# Patient Record
Sex: Male | Born: 1990 | Race: Black or African American | Hispanic: No | Marital: Single | Smoking: Never smoker
Health system: Southern US, Community
[De-identification: ages and names within clinical notes are randomized; demographics above are authoritative.]

---
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
## 2011-02-07 ENCOUNTER — Emergency Department (HOSPITAL_COMMUNITY): Payer: Self-pay

## 2011-02-07 ENCOUNTER — Emergency Department (HOSPITAL_COMMUNITY)
Admission: EM | Admit: 2011-02-07 | Discharge: 2011-02-07 | Disposition: A | Discharge status: Home / Self Care | Payer: Self-pay | Attending: Emergency Medicine | Admitting: Emergency Medicine

## 2011-02-07 DIAGNOSIS — M542 Cervicalgia: Secondary | ICD-10-CM | POA: Insufficient documentation

## 2011-02-07 DIAGNOSIS — S060X0A Concussion without loss of consciousness, initial encounter: Secondary | ICD-10-CM | POA: Insufficient documentation

## 2011-02-07 DIAGNOSIS — S139XXA Sprain of joints and ligaments of unspecified parts of neck, initial encounter: Secondary | ICD-10-CM | POA: Insufficient documentation

## 2011-02-07 DIAGNOSIS — R51 Headache: Secondary | ICD-10-CM | POA: Insufficient documentation

## 2011-02-07 DIAGNOSIS — R21 Rash and other nonspecific skin eruption: Secondary | ICD-10-CM | POA: Insufficient documentation

## 2011-02-07 DIAGNOSIS — Y929 Unspecified place or not applicable: Secondary | ICD-10-CM | POA: Insufficient documentation

## 2011-06-24 LAB — RAPID STREP SCREEN (MED CTR MEBANE ONLY): Streptococcus, Group A Screen (Direct): NEGATIVE

## 2011-12-21 ENCOUNTER — Encounter (HOSPITAL_COMMUNITY): Payer: Self-pay | Admitting: *Deleted

## 2011-12-21 ENCOUNTER — Emergency Department (HOSPITAL_COMMUNITY)
Admission: EM | Admit: 2011-12-21 | Discharge: 2011-12-21 | Disposition: A | Discharge status: Home / Self Care | Payer: No Typology Code available for payment source | Attending: Emergency Medicine | Admitting: Emergency Medicine

## 2011-12-21 ENCOUNTER — Emergency Department (HOSPITAL_COMMUNITY): Payer: No Typology Code available for payment source

## 2011-12-21 DIAGNOSIS — S40011A Contusion of right shoulder, initial encounter: Secondary | ICD-10-CM

## 2011-12-21 DIAGNOSIS — S60221A Contusion of right hand, initial encounter: Secondary | ICD-10-CM

## 2011-12-21 DIAGNOSIS — S60229A Contusion of unspecified hand, initial encounter: Secondary | ICD-10-CM | POA: Insufficient documentation

## 2011-12-21 DIAGNOSIS — Y92410 Unspecified street and highway as the place of occurrence of the external cause: Secondary | ICD-10-CM | POA: Insufficient documentation

## 2011-12-21 DIAGNOSIS — S40019A Contusion of unspecified shoulder, initial encounter: Secondary | ICD-10-CM | POA: Insufficient documentation

## 2011-12-21 MED ORDER — HYDROCODONE-ACETAMINOPHEN 5-500 MG PO TABS: 1.0000 | ORAL_TABLET | Freq: Four times a day (QID) | ORAL | Status: AC | PRN

## 2011-12-21 NOTE — ED Notes (Signed)
Also c/o right hand pain

## 2011-12-21 NOTE — Discharge Instructions (Signed)
° °
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
## 2012-07-31 ENCOUNTER — Encounter (HOSPITAL_COMMUNITY): Payer: Self-pay | Admitting: *Deleted

## 2012-07-31 ENCOUNTER — Emergency Department (HOSPITAL_COMMUNITY)
Admission: EM | Admit: 2012-07-31 | Discharge: 2012-07-31 | Disposition: A | Discharge status: Home / Self Care | Payer: BC Managed Care – HMO | Attending: Emergency Medicine | Admitting: Emergency Medicine

## 2012-07-31 DIAGNOSIS — Y939 Activity, unspecified: Secondary | ICD-10-CM | POA: Insufficient documentation

## 2012-07-31 DIAGNOSIS — Y929 Unspecified place or not applicable: Secondary | ICD-10-CM | POA: Insufficient documentation

## 2012-07-31 DIAGNOSIS — T2220XA Burn of second degree of shoulder and upper limb, except wrist and hand, unspecified site, initial encounter: Secondary | ICD-10-CM | POA: Insufficient documentation

## 2012-07-31 DIAGNOSIS — X12XXXA Contact with other hot fluids, initial encounter: Secondary | ICD-10-CM | POA: Insufficient documentation

## 2012-07-31 DIAGNOSIS — X131XXA Other contact with steam and other hot vapors, initial encounter: Secondary | ICD-10-CM | POA: Insufficient documentation

## 2012-07-31 MED ORDER — SILVER SULFADIAZINE 1 % EX CREA
TOPICAL_CREAM | CUTANEOUS | Status: AC
  Administered 2012-07-31: 19:00:00
  Filled 2012-07-31: qty 50

## 2012-07-31 MED ORDER — SODIUM CHLORIDE 0.9 % IV BOLUS (SEPSIS)
1000.0000 mL | Freq: Once | INTRAVENOUS | Status: AC
  Administered 2012-07-31: 1000 mL via INTRAVENOUS

## 2012-07-31 MED ORDER — TETANUS-DIPHTH-ACELL PERTUSSIS 5-2.5-18.5 LF-MCG/0.5 IM SUSP
0.5000 mL | Freq: Once | INTRAMUSCULAR | Status: AC
  Administered 2012-07-31: 0.5 mL via INTRAMUSCULAR
  Filled 2012-07-31: qty 0.5

## 2012-07-31 MED ORDER — KETOROLAC TROMETHAMINE 30 MG/ML IJ SOLN
30.0000 mg | Freq: Once | INTRAMUSCULAR | Status: AC
  Administered 2012-07-31: 30 mg via INTRAVENOUS
  Filled 2012-07-31: qty 1

## 2012-07-31 MED ORDER — ONDANSETRON HCL 4 MG/2ML IJ SOLN
4.0000 mg | Freq: Once | INTRAMUSCULAR | Status: AC
  Administered 2012-07-31: 4 mg via INTRAVENOUS
  Filled 2012-07-31: qty 2

## 2012-07-31 MED ORDER — OXYCODONE-ACETAMINOPHEN 5-325 MG PO TABS: 1.0000 | ORAL_TABLET | Freq: Four times a day (QID) | ORAL | Status: DC | PRN

## 2012-07-31 MED ORDER — HYDROMORPHONE HCL PF 1 MG/ML IJ SOLN
1.0000 mg | Freq: Once | INTRAMUSCULAR | Status: AC
  Administered 2012-07-31: 1 mg via INTRAVENOUS
  Filled 2012-07-31: qty 1

## 2012-07-31 MED ORDER — PROMETHAZINE HCL 25 MG PO TABS: 25.0000 mg | ORAL_TABLET | Freq: Four times a day (QID) | ORAL | Status: DC | PRN

## 2012-07-31 NOTE — ED Notes (Signed)
Bedside report received from previous RN 
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
## 2012-07-31 NOTE — ED Notes (Signed)
Pt alert and oriented x4. Respirations even and unlabored, bilateral symmetrical rise and fall of chest. Skin warm and dry. In no acute distress. Denies needs.   
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
## 2012-08-07 ENCOUNTER — Encounter (HOSPITAL_COMMUNITY): Payer: Self-pay | Admitting: Emergency Medicine

## 2012-08-07 ENCOUNTER — Emergency Department (HOSPITAL_COMMUNITY)
Admission: EM | Admit: 2012-08-07 | Discharge: 2012-08-07 | Disposition: A | Discharge status: Home / Self Care | Payer: BC Managed Care – HMO | Attending: Emergency Medicine | Admitting: Emergency Medicine

## 2012-08-07 DIAGNOSIS — Y929 Unspecified place or not applicable: Secondary | ICD-10-CM | POA: Insufficient documentation

## 2012-08-07 DIAGNOSIS — Z48817 Encounter for surgical aftercare following surgery on the skin and subcutaneous tissue: Secondary | ICD-10-CM | POA: Insufficient documentation

## 2012-08-07 DIAGNOSIS — Z5189 Encounter for other specified aftercare: Secondary | ICD-10-CM

## 2012-08-07 DIAGNOSIS — Y939 Activity, unspecified: Secondary | ICD-10-CM | POA: Insufficient documentation

## 2012-08-07 DIAGNOSIS — X12XXXA Contact with other hot fluids, initial encounter: Secondary | ICD-10-CM | POA: Insufficient documentation

## 2012-08-07 DIAGNOSIS — T22219A Burn of second degree of unspecified forearm, initial encounter: Secondary | ICD-10-CM | POA: Insufficient documentation

## 2012-08-07 DIAGNOSIS — IMO0002 Reserved for concepts with insufficient information to code with codable children: Secondary | ICD-10-CM

## 2012-08-07 MED ORDER — SILVER SULFADIAZINE 1 % EX CREA
TOPICAL_CREAM | Freq: Every day | CUTANEOUS | Status: DC
  Administered 2012-08-07: 20:00:00 via TOPICAL
  Filled 2012-08-07: qty 50

## 2012-08-07 NOTE — ED Provider Notes (Signed)
Medical screening examination/treatment/procedure(s) were performed by non-physician practitioner and as supervising physician I was immediately available for consultation/collaboration.  Karilyn Wind L Wretha Laris, MD 08/07/12 2329 
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
## 2013-08-21 ENCOUNTER — Encounter (HOSPITAL_COMMUNITY): Payer: Self-pay | Admitting: Emergency Medicine

## 2013-08-21 ENCOUNTER — Emergency Department (INDEPENDENT_AMBULATORY_CARE_PROVIDER_SITE_OTHER)
Admission: EM | Admit: 2013-08-21 | Discharge: 2013-08-21 | Disposition: A | Discharge status: Home / Self Care | Payer: Self-pay | Source: Home / Self Care | Attending: Emergency Medicine | Admitting: Emergency Medicine

## 2013-08-21 DIAGNOSIS — J069 Acute upper respiratory infection, unspecified: Secondary | ICD-10-CM

## 2013-08-21 NOTE — ED Provider Notes (Signed)
Medical screening examination/treatment/procedure(s) were performed by non-physician practitioner and as supervising physician I was immediately available for consultation/collaboration.  Leslee Home, M.D.  Reuben Likes, MD 08/21/13 [phone]

## 2013-08-21 NOTE — ED Provider Notes (Signed)
CSN: [id number]     Arrival date & time 08/21/13  1843 History   First MD Initiated Contact with Patient 08/21/13 1906     Chief Complaint  Patient presents with  . Sore Throat    Friday started having sore throat, coughing.     (Consider location/radiation/quality/duration/timing/severity/associated sxs/prior Treatment) Patient is a 22 y.o. male presenting with URI. The history is provided by the patient.  URI Presenting symptoms: congestion, cough, ear pain, fever, rhinorrhea and sore throat   Presenting symptoms: no facial pain and no fatigue   Severity:  Moderate Onset quality:  Gradual Duration:  2 days Timing:  Constant Progression:  Unchanged Chronicity:  New Relieved by:  Nothing Worsened by:  Nothing tried Ineffective treatments:  OTC medications (tussin cf) Associated symptoms: no myalgias, no sinus pain and no swollen glands     History reviewed. No pertinent past medical history. History reviewed. No pertinent past surgical history. History reviewed. No pertinent family history. History  Substance Use Topics  . Smoking status: Never Smoker   . Smokeless tobacco: Never Used  . Alcohol Use: No    Review of Systems  Constitutional: Positive for fever and chills. Negative for fatigue.  HENT: Positive for congestion, ear pain, postnasal drip, rhinorrhea and sore throat. Negative for sinus pressure.   Respiratory: Positive for cough.   Musculoskeletal: Negative for myalgias.    Allergies  Review of patient's allergies indicates no known allergies.  Home Medications   Current Outpatient Rx  Name  Route  Sig  Dispense  Refill  . oxyCODONE-acetaminophen (PERCOCET/ROXICET) 5-325 MG per tablet   Oral   Take 1-2 tablets by mouth every 6 (six) hours as needed for pain.   20 tablet   0   . silver sulfADIAZINE (SILVADENE) 1 % cream   Topical   Apply 1 application topically 2 (two) times daily. For burn          BP 129/76  Pulse 86  Temp(Src) 100.6 F  (38.1 C) (Oral)  Resp 18  SpO2 99% Physical Exam  Constitutional: He appears well-developed and well-nourished. No distress.  HENT:  Right Ear: Tympanic membrane, external ear and ear canal normal.  Left Ear: Tympanic membrane, external ear and ear canal normal.  Nose: Mucosal edema and rhinorrhea present. Right sinus exhibits no maxillary sinus tenderness and no frontal sinus tenderness. Left sinus exhibits no maxillary sinus tenderness and no frontal sinus tenderness.  Mouth/Throat: Oropharynx is clear and moist and mucous membranes are normal.  Cardiovascular: Normal rate and regular rhythm.   Pulmonary/Chest: Effort normal and breath sounds normal.  Lymphadenopathy:       Head (right side): No submental, no submandibular and no tonsillar adenopathy present.       Head (left side): No submental, no submandibular and no tonsillar adenopathy present.    ED Course  Procedures (including critical care time) Labs Review Labs Reviewed - No data to display Imaging Review No results found.  EKG Interpretation    Date/Time:    Ventricular Rate:    PR Interval:    QRS Duration:   QT Interval:    QTC Calculation:   R Axis:     Text Interpretation:              MDM   1. URI (upper respiratory infection)   recommended saline nasal spray and cold medicine with decongestant.     Cathlyn Parsons, NP 08/21/13 [phone]

## 2013-08-21 NOTE — ED Notes (Signed)
Having a sore throat when coughs.  Has been cold past few days.  No nausea, vomitting, or diahrrea.

## 2013-09-19 ENCOUNTER — Emergency Department (HOSPITAL_COMMUNITY): Payer: Self-pay

## 2013-09-19 ENCOUNTER — Encounter (HOSPITAL_COMMUNITY): Payer: Self-pay | Admitting: Emergency Medicine

## 2013-09-19 ENCOUNTER — Emergency Department (HOSPITAL_COMMUNITY)
Admission: EM | Admit: 2013-09-19 | Discharge: 2013-09-19 | Disposition: A | Discharge status: Home / Self Care | Payer: Self-pay | Attending: Emergency Medicine | Admitting: Emergency Medicine

## 2013-09-19 DIAGNOSIS — K5289 Other specified noninfective gastroenteritis and colitis: Secondary | ICD-10-CM | POA: Insufficient documentation

## 2013-09-19 DIAGNOSIS — K529 Noninfective gastroenteritis and colitis, unspecified: Secondary | ICD-10-CM

## 2013-09-19 LAB — BASIC METABOLIC PANEL
BUN: 13 mg/dL (ref 6–23)
CALCIUM: 9.1 mg/dL (ref 8.4–10.5)
CO2: 27 meq/L (ref 19–32)
CREATININE: 1.31 mg/dL (ref 0.50–1.35)
Chloride: 99 mEq/L (ref 96–112)
GFR calc Af Amer: 89 mL/min — ABNORMAL LOW (ref >90)
GFR calc non Af Amer: 77 mL/min — ABNORMAL LOW (ref >90)
GLUCOSE: 132 mg/dL — AB (ref 70–99)
Potassium: 4.4 mEq/L (ref 3.7–5.3)
Sodium: 138 mEq/L (ref 137–147)

## 2013-09-19 LAB — CBC WITH DIFFERENTIAL/PLATELET
BASOS ABS: 0 10*3/uL (ref 0.0–0.1)
Basophils Relative: 0 % (ref 0–1)
EOS PCT: 0 % (ref 0–5)
Eosinophils Absolute: 0 10*3/uL (ref 0.0–0.7)
HEMATOCRIT: 47 % (ref 39.0–52.0)
HEMOGLOBIN: 15.5 g/dL (ref 13.0–17.0)
LYMPHS PCT: 3 % — AB (ref 12–46)
Lymphs Abs: 0.3 10*3/uL — ABNORMAL LOW (ref 0.7–4.0)
MCH: 29.5 pg (ref 26.0–34.0)
MCHC: 33 g/dL (ref 30.0–36.0)
MCV: 89.5 fL (ref 78.0–100.0)
MONO ABS: 0.4 10*3/uL (ref 0.1–1.0)
MONOS PCT: 4 % (ref 3–12)
Neutro Abs: 9.6 10*3/uL — ABNORMAL HIGH (ref 1.7–7.7)
Neutrophils Relative %: 92 % — ABNORMAL HIGH (ref 43–77)
Platelets: 177 10*3/uL (ref 150–400)
RBC: 5.25 MIL/uL (ref 4.22–5.81)
RDW: 13.1 % (ref 11.5–15.5)
WBC: 10.4 10*3/uL (ref 4.0–10.5)

## 2013-09-19 MED ORDER — MORPHINE SULFATE 4 MG/ML IJ SOLN
4.0000 mg | Freq: Once | INTRAMUSCULAR | Status: AC
  Administered 2013-09-19: 4 mg via INTRAVENOUS
  Filled 2013-09-19: qty 1

## 2013-09-19 MED ORDER — ACETAMINOPHEN 325 MG PO TABS
650.0000 mg | ORAL_TABLET | Freq: Once | ORAL | Status: DC
  Filled 2013-09-19: qty 2

## 2013-09-19 MED ORDER — IOHEXOL 300 MG/ML  SOLN: 50.0000 mL | Freq: Once | INTRAMUSCULAR | Status: DC | PRN

## 2013-09-19 MED ORDER — IOHEXOL 300 MG/ML  SOLN
100.0000 mL | Freq: Once | INTRAMUSCULAR | Status: AC | PRN
  Administered 2013-09-19: 100 mL via INTRAVENOUS

## 2013-09-19 MED ORDER — KETOROLAC TROMETHAMINE 30 MG/ML IJ SOLN
30.0000 mg | Freq: Once | INTRAMUSCULAR | Status: AC
  Administered 2013-09-19: 30 mg via INTRAVENOUS
  Filled 2013-09-19: qty 1

## 2013-09-19 MED ORDER — ACETAMINOPHEN 325 MG PO TABS
650.0000 mg | ORAL_TABLET | Freq: Once | ORAL | Status: AC
  Administered 2013-09-19: 650 mg via ORAL
  Filled 2013-09-19: qty 2

## 2013-09-19 MED ORDER — PROMETHAZINE HCL 25 MG PO TABS: 25.0000 mg | ORAL_TABLET | Freq: Four times a day (QID) | ORAL | Status: DC | PRN

## 2013-09-19 MED ORDER — TRAMADOL HCL 50 MG PO TABS: 50.0000 mg | ORAL_TABLET | Freq: Four times a day (QID) | ORAL | Status: DC | PRN

## 2013-09-19 MED ORDER — ONDANSETRON HCL 4 MG/2ML IJ SOLN
4.0000 mg | Freq: Once | INTRAMUSCULAR | Status: AC
  Administered 2013-09-19: 4 mg via INTRAVENOUS
  Filled 2013-09-19: qty 2

## 2013-09-19 MED ORDER — SODIUM CHLORIDE 0.9 % IV BOLUS (SEPSIS)
1000.0000 mL | Freq: Once | INTRAVENOUS | Status: AC
  Administered 2013-09-19: 1000 mL via INTRAVENOUS

## 2013-09-19 NOTE — ED Provider Notes (Signed)
CSN: [id number]     Arrival date & time 09/19/13  1402 History   First MD Initiated Contact with Patient 09/19/13 1535     Chief Complaint  Patient presents with  . Emesis  . Diarrhea   (Consider location/radiation/quality/duration/timing/severity/associated sxs/prior Treatment) HPI  Jason Campbell is a 22 y.o.male without any significant PMH presents to the ER with complaints of vomiting 4 times and diarrhea x3 episodes since 3am this morning. He is not having any belly pain with this but is having some lower extremity body aches and some low back crampy pain. He denies have fevers, sore throat, ear pain, headache, weakness, confusion, hematuria/dysuria.   History reviewed. No pertinent past medical history. History reviewed. No pertinent past surgical history. No family history on file. History  Substance Use Topics  . Smoking status: Never Smoker   . Smokeless tobacco: Never Used  . Alcohol Use: No    Review of Systems The patient denies anorexia, fever, weight loss,, vision loss, decreased hearing, hoarseness, chest pain, syncope, dyspnea on exertion, peripheral edema, balance deficits, hemoptysis, abdominal pain, melena, hematochezia, severe indigestion/heartburn, hematuria, incontinence, genital sores, muscle weakness, suspicious skin lesions, transient blindness, difficulty walking, depression, unusual weight change, abnormal bleeding, enlarged lymph nodes, angioedema, and breast masses.  Allergies  Review of patient's allergies indicates no known allergies.  Home Medications   Current Outpatient Rx  Name  Route  Sig  Dispense  Refill  . promethazine (PHENERGAN) 25 MG tablet   Oral   Take 1 tablet (25 mg total) by mouth every 6 (six) hours as needed for nausea or vomiting.   30 tablet   0   . traMADol (ULTRAM) 50 MG tablet   Oral   Take 1 tablet (50 mg total) by mouth every 6 (six) hours as needed.   20 tablet   0    BP 103/82  Pulse 86  Temp(Src) 98.4  F (36.9 C) (Oral)  Resp 18  SpO2 99% Physical Exam  Nursing note and vitals reviewed. Constitutional: He appears well-developed and well-nourished. No distress.  HENT:  Head: Normocephalic and atraumatic.  Eyes: Pupils are equal, round, and reactive to light.  Neck: Normal range of motion. Neck supple.  Cardiovascular: Normal rate and regular rhythm.   Pulmonary/Chest: Effort normal. No respiratory distress. He has no wheezes.  Abdominal: Soft. There is no tenderness. There is no rigidity, no rebound, no guarding, no CVA tenderness, no tenderness at McBurney's point and negative Murphy's sign.  Neurological: He is alert.  Skin: Skin is warm and dry.    ED Course  Procedures (including critical care time) Labs Review Labs Reviewed  CBC WITH DIFFERENTIAL - Abnormal; Notable for the following:    Neutrophils Relative % 92 (*)    Neutro Abs 9.6 (*)    Lymphocytes Relative 3 (*)    Lymphs Abs 0.3 (*)    All other components within normal limits  BASIC METABOLIC PANEL - Abnormal; Notable for the following:    Glucose, Bld 132 (*)    GFR calc non Af Amer 77 (*)    GFR calc Af Amer 89 (*)    All other components within normal limits   Imaging Review Ct Abdomen Pelvis W Contrast  09/19/2013   CLINICAL DATA:  Abdominal pain. Nausea and vomiting. Abnormal abdominal radiograph.  EXAM: CT ABDOMEN AND PELVIS WITH CONTRAST  TECHNIQUE: Multidetector CT imaging of the abdomen and pelvis was performed using the standard protocol following bolus administration of intravenous contrast.  CONTRAST:  1 OMNIPAQUE IOHEXOL 300 MG/ML SOLN, 100mL OMNIPAQUE IOHEXOL 300 MG/ML SOLN  COMPARISON:  DG ABD 2 VIEWS dated 09/19/2013  FINDINGS: Lung Bases: Clear.  Liver:  Normal.  Spleen:  Normal.  Gallbladder:  Normal.  Common bile duct:  Normal.  Pancreas:  Normal.  Adrenal glands:  Normal.  Kidneys: Normal renal enhancement. No calculi. The ureters appear normal.  Stomach:  Mostly collapsed.  No inflammatory  changes.  Small bowel: There are no dilated loops of small bowel. Small bowel loop identified on prior plain films distended with oral contrast. There is no obstruction. Maximal diameter of small bowel is 27 mm. Distal small bowel appears normal. Ingested oral contrast is about halfway through the small bowel.  Colon: Normal appendix adjacent to the cecum. No right lower quadrant inflammatory changes. The colon appears within normal limits. The distal colon is decompressed.  Pelvic Genitourinary:  Normal urinary bladder.  No free fluid.  Bones: Schmorl's nodes are present in the thoracolumbar spine. No acute abnormality.  Vasculature: Normal.  Body Wall: Normal.  IMPRESSION: Normal CT abdomen and pelvis. No dilation of small bowel or evidence of enteritis or obstruction.   Electronically Signed   By: Andreas NewportGeoffrey  Lamke M.D.   On: 09/19/2013 18:54   Dg Abd 2 Views  09/19/2013   CLINICAL DATA:  Diarrhea and vomiting.  Low back pain.  EXAM: ABDOMEN - 2 VIEW  COMPARISON:  None.  FINDINGS: There is a mildly dilated loop of small bowel in the left central abdomen. This may be associated with obstruction or enteritis/infection. Scattered air-fluid levels are present. There is thickening of the small bowel wall markings. Colonic gas is present. Phleboliths present in the anatomic pelvis.  There is no free air or organomegaly.  IMPRESSION: Single loop of dilated small bowel in the left abdomen. This may be associated with obstruction, enteritis (including infectious). CT may be useful further assessment, preferably with oral and IV contrast.   Electronically Signed   By: Andreas NewportGeoffrey  Lamke M.D.   On: 09/19/2013 17:02    EKG Interpretation   None       MDM   1. Gastroenteritis    Plan films have come back with concerns for possible obstruction of enteritis.  The labs and CT scan of the abdomen/ pelvis are reassuring.  Maintain hydration by drinking small amounts of clear fluids frequently, then soft diet, and  then advance diet as tolerated. May use OTC Imodium if desired for any diarrhea.  Call if symptoms worsen, high fever, severe weakness or fainting, increased abdominal pain, blood in stool or vomit, or failure to improve in 2-3 days.  Rx; phenergan and Ultram  22 y.o.Jason Campbell's evaluation in the Emergency Department is complete. It has been determined that no acute conditions requiring further emergency intervention are present at this time. The patient/guardian have been advised of the diagnosis and plan. We have discussed signs and symptoms that warrant return to the ED, such as changes or worsening in symptoms.  Vital signs are stable at discharge. Filed Vitals:   09/19/13 1414  BP: 103/82  Pulse: 86  Temp: 98.4 F (36.9 C)  Resp: 18    Patient/guardian has voiced understanding and agreed to follow-up with the PCP or specialist.     Dorthula Matasiffany G Dorma Altman, PA-C 09/19/13 1908

## 2013-09-19 NOTE — ED Notes (Addendum)
Pt states last night he vomited 4 times and this morning 3 times and around 1200 vomited. Pt states he has also been having diarrhea. C/o lower back and legs aching, and chills.

## 2013-09-19 NOTE — Discharge Instructions (Signed)
Viral Gastroenteritis Viral gastroenteritis is also known as stomach flu. This condition affects the stomach and intestinal tract. It can cause sudden diarrhea and vomiting. The illness typically lasts 3 to 8 days. Most people develop an immune response that eventually gets rid of the virus. While this natural response develops, the virus can make you quite ill. CAUSES  Many different viruses can cause gastroenteritis, such as rotavirus or noroviruses. You can catch one of these viruses by consuming contaminated food or water. You may also catch a virus by sharing utensils or other personal items with an infected person or by touching a contaminated surface. SYMPTOMS  The most common symptoms are diarrhea and vomiting. These problems can cause a severe loss of body fluids (dehydration) and a body salt (electrolyte) imbalance. Other symptoms may include:  Fever.  Headache.  Fatigue.  Abdominal pain. DIAGNOSIS  Your caregiver can usually diagnose viral gastroenteritis based on your symptoms and a physical exam. A stool sample may also be taken to test for the presence of viruses or other infections. TREATMENT  This illness typically goes away on its own. Treatments are aimed at rehydration. The most serious cases of viral gastroenteritis involve vomiting so severely that you are not able to keep fluids down. In these cases, fluids must be given through an intravenous line (IV). HOME CARE INSTRUCTIONS   Drink enough fluids to keep your urine clear or pale yellow. Drink small amounts of fluids frequently and increase the amounts as tolerated.  Ask your caregiver for specific rehydration instructions.  Avoid:  Foods high in sugar.  Alcohol.  Carbonated drinks.  Tobacco.  Juice.  Caffeine drinks.  Extremely hot or cold fluids.  Fatty, greasy foods.  Too much intake of anything at one time.  Dairy products until 24 to 48 hours after diarrhea stops.  You may consume probiotics.  Probiotics are active cultures of beneficial bacteria. They may lessen the amount and number of diarrheal stools in adults. Probiotics can be found in yogurt with active cultures and in supplements.  Wash your hands well to avoid spreading the virus.  Only take over-the-counter or prescription medicines for pain, discomfort, or fever as directed by your caregiver. Do not give aspirin to children. Antidiarrheal medicines are not recommended.  Ask your caregiver if you should continue to take your regular prescribed and over-the-counter medicines.  Keep all follow-up appointments as directed by your caregiver. SEEK IMMEDIATE MEDICAL CARE IF:   You are unable to keep fluids down.  You do not urinate at least once every 6 to 8 hours.  You develop shortness of breath.  You notice blood in your stool or vomit. This may look like coffee grounds.  You have abdominal pain that increases or is concentrated in one small area (localized).  You have persistent vomiting or diarrhea.  You have a fever.  The patient is a child younger than 3 months, and he or she has a fever.  The patient is a child older than 3 months, and he or she has a fever and persistent symptoms.  The patient is a child older than 3 months, and he or she has a fever and symptoms suddenly get worse.  The patient is a baby, and he or she has no tears when crying. MAKE SURE YOU:   Understand these instructions.  Will watch your condition.  Will get help right away if you are not doing well or get worse. Document Released: 08/25/2005 Document Revised: 11/17/2011 Document Reviewed: 06/11/2011   [employer] Patient Information 2014 [employer]  

## 2013-09-19 NOTE — ED Notes (Signed)
Patient transported to CT 

## 2013-09-19 NOTE — ED Provider Notes (Signed)
Medical screening examination/treatment/procedure(s) were performed by non-physician practitioner and as supervising physician I was immediately available for consultation/collaboration.    Liev L Kamal Jurgens, MD 09/19/13 1948 

## 2013-09-19 NOTE — Progress Notes (Signed)
   CARE MANAGEMENT ED NOTE 09/19/2013  Patient:  Jason Campbell,Jason Campbell   Account Number:  [id number]  Date Initiated:  09/19/2013  Documentation initiated by:  Radford PaxFERRERO,Sahaj Bona  Subjective/Objective Assessment:   Jason Campbell is a 23 y.o.male without any significant PMH presents to the ER with complaints of vomiting 4 times and diarrhea x3 episodes since 3am this morning.     Subjective/Objective Assessment Detail:     Action/Plan:   Action/Plan Detail:   Anticipated DC Date:       Status Recommendation to Physician:   Result of Recommendation:    Other ED Services  Consult Working Plan    DC Planning Services  Other  PCP issues    Choice offered to / List presented to:            Status of service:  Completed, signed off  ED Comments:   ED Comments Detail:  EDCM spoke to patient's mother and grandmother at bedside, patient asleep.  As per patient's mother [facility] but, "The physicians have changed over there."  Patient currently does not have any insurance but will be starting a new job soon which will have benefits.  EDCM provided patient's mother with a list of pcps who accept self pay patients, list of discount pharmacies and website needymeds.org for medication assistance, information regaridng Medicaid and the Affordab.e Care Act for insurance, list of financial resources inthe community sucha as local churches and [employer], and dental assistance for patients without insurance.  Patient's  mother thankful for resources.  No further needs at this itme.

## 2013-09-19 NOTE — ED Notes (Signed)
Pt verbalizes understanding 

## 2014-11-21 ENCOUNTER — Encounter (HOSPITAL_COMMUNITY): Payer: Self-pay | Admitting: Emergency Medicine

## 2014-11-21 ENCOUNTER — Emergency Department (HOSPITAL_COMMUNITY)
Admission: EM | Admit: 2014-11-21 | Discharge: 2014-11-21 | Disposition: A | Discharge status: Home / Self Care | Payer: Self-pay | Attending: Emergency Medicine | Admitting: Emergency Medicine

## 2014-11-21 DIAGNOSIS — J0100 Acute maxillary sinusitis, unspecified: Secondary | ICD-10-CM | POA: Insufficient documentation

## 2014-11-21 MED ORDER — AZITHROMYCIN 250 MG PO TABS
500.0000 mg | ORAL_TABLET | Freq: Once | ORAL | Status: AC
  Administered 2014-11-21: 500 mg via ORAL
  Filled 2014-11-21: qty 2

## 2014-11-21 MED ORDER — AZITHROMYCIN 250 MG PO TABS: 250.0000 mg | ORAL_TABLET | Freq: Every day | ORAL | Status: DC

## 2014-11-21 NOTE — ED Provider Notes (Signed)
CSN: [id number]     Arrival date & time 11/21/14  2019 History  This chart was scribed for Jason Anis, PA-C, working with Glynn Octave, MD by Elon Spanner, ED Scribe. This patient was seen in room WTR9/WTR9 and the patient's care was started at 9:36 PM.   Chief Complaint  Patient presents with  . Fever  . URI   Patient is a 24 y.o. male presenting with URI. The history is provided by the patient. No language interpreter was used.  URI Presenting symptoms: cough, fever, rhinorrhea and sore throat   Associated symptoms: headaches    HPI Comments: Jason Campbell is a 24 y.o. male who presents to the Emergency Department complaining of a fever TMAX 100.1 with associated productive cough, sore throat generalized body aches, frontal headache, and rhinorrhea onset two days ago.  Patient has taken benadryl.  Patient denies recent sick contacts. Patient denies smoking cigarettes.  Patient denies vomiting, diarrhea.  NKA.  History reviewed. No pertinent past medical history. History reviewed. No pertinent past surgical history. History reviewed. No pertinent family history. History  Substance Use Topics  . Smoking status: Never Smoker   . Smokeless tobacco: Never Used  . Alcohol Use: No    Review of Systems  Constitutional: Positive for fever.  HENT: Positive for rhinorrhea and sore throat.   Respiratory: Positive for cough.   Gastrointestinal: Negative for nausea and vomiting.  Neurological: Positive for headaches.      Allergies  Review of patient's allergies indicates no known allergies.  Home Medications   Prior to Admission medications   Medication Sig Start Date End Date Taking? Authorizing Provider  promethazine (PHENERGAN) 25 MG tablet Take 1 tablet (25 mg total) by mouth every 6 (six) hours as needed for nausea or vomiting. 09/19/13   Marlon Pel, PA-C  traMADol (ULTRAM) 50 MG tablet Take 1 tablet (50 mg total) by mouth every 6 (six) hours as needed. 09/19/13    Tiffany Neva Seat, PA-C   BP 114/66 mmHg  Pulse 89  Temp(Src) 100.4 F (38 C) (Oral)  SpO2 99% Physical Exam  Constitutional: He is oriented to person, place, and time. He appears well-developed and well-nourished. No distress.  HENT:  Head: Normocephalic and atraumatic.  Nasal edema and erythema with bilateral maxillary sinus tenderness.  Oropharynx is red without exudate or swelling.  Cervical adenopathy present.    Eyes: Conjunctivae and EOM are normal.  Neck: Neck supple. No tracheal deviation present.  Cardiovascular: Normal rate.   Pulmonary/Chest: Effort normal. No respiratory distress.  Lungs CTA.   Musculoskeletal: Normal range of motion.  Neurological: He is alert and oriented to person, place, and time.  Skin: Skin is warm and dry.  Psychiatric: He has a normal mood and affect. His behavior is normal.  Nursing note and vitals reviewed.   ED Course  Procedures (including critical care time)  DIAGNOSTIC STUDIES: Oxygen Saturation is 99% on RA, normal by my interpretation.    COORDINATION OF CARE:  9:39 PM Discussed treatment plan with patient at bedside.  Patient acknowledges and agrees with plan.    Labs Review Labs Reviewed - No data to display  Imaging Review No results found.   EKG Interpretation None      MDM   Final diagnoses:  None    1. Sinusitis  Ill appearing patient with symptoms of sinusitis treatable with antibiotics.   I personally performed the services described in this documentation, which was scribed in my presence. The recorded information has  been reviewed and is accurate.     Jason AnisShari Shaneeka Scarboro, PA-C 11/22/14 [id number]  Glynn OctaveStephen Rancour, MD 11/22/14 [phone]

## 2014-11-21 NOTE — Discharge Instructions (Signed)

## 2014-11-21 NOTE — ED Notes (Signed)
Pt states he has had fever, productive cough, runny nose and ha x 2 days. Alert and oriented.

## 2015-01-24 ENCOUNTER — Encounter (HOSPITAL_COMMUNITY): Payer: Self-pay | Admitting: Emergency Medicine

## 2015-01-24 ENCOUNTER — Emergency Department (HOSPITAL_COMMUNITY)
Admission: EM | Admit: 2015-01-24 | Discharge: 2015-01-24 | Disposition: A | Discharge status: Home / Self Care | Payer: Self-pay | Attending: Emergency Medicine | Admitting: Emergency Medicine

## 2015-01-24 DIAGNOSIS — Z792 Long term (current) use of antibiotics: Secondary | ICD-10-CM | POA: Insufficient documentation

## 2015-01-24 DIAGNOSIS — J020 Streptococcal pharyngitis: Secondary | ICD-10-CM | POA: Insufficient documentation

## 2015-01-24 MED ORDER — HYDROCODONE-ACETAMINOPHEN 5-325 MG PO TABS: 1.0000 | ORAL_TABLET | ORAL | Status: DC | PRN

## 2015-01-24 MED ORDER — PREDNISONE 20 MG PO TABS: 40.0000 mg | ORAL_TABLET | Freq: Every day | ORAL | Status: DC

## 2015-01-24 MED ORDER — IBUPROFEN 200 MG PO TABS
600.0000 mg | ORAL_TABLET | Freq: Once | ORAL | Status: AC
  Administered 2015-01-24: 600 mg via ORAL
  Filled 2015-01-24: qty 3

## 2015-01-24 MED ORDER — PENICILLIN G BENZATHINE 1200000 UNIT/2ML IM SUSP
1.2000 10*6.[IU] | Freq: Once | INTRAMUSCULAR | Status: AC
  Administered 2015-01-24: 1.2 10*6.[IU] via INTRAMUSCULAR
  Filled 2015-01-24: qty 2

## 2015-01-24 MED ORDER — PREDNISONE 20 MG PO TABS
60.0000 mg | ORAL_TABLET | Freq: Once | ORAL | Status: AC
  Administered 2015-01-24: 60 mg via ORAL
  Filled 2015-01-24: qty 3

## 2015-01-24 MED ORDER — ONDANSETRON 8 MG PO TBDP
8.0000 mg | ORAL_TABLET | Freq: Once | ORAL | Status: AC
  Administered 2015-01-24: 8 mg via ORAL
  Filled 2015-01-24: qty 1

## 2015-01-24 MED ORDER — HYDROCODONE-ACETAMINOPHEN 5-325 MG PO TABS
1.0000 | ORAL_TABLET | Freq: Once | ORAL | Status: AC
  Administered 2015-01-24: 1 via ORAL
  Filled 2015-01-24: qty 1

## 2015-01-24 NOTE — ED Provider Notes (Signed)
CSN: [id number]     Arrival date & time 01/24/15  0449 History   First MD Initiated Contact with Patient 01/24/15 0505     Chief Complaint  Patient presents with  . Fever  . Sore Throat   (Consider location/radiation/quality/duration/timing/severity/associated sxs/prior Treatment) HPI  Jason Campbell is a 24 year old male presenting with fever and sore throat. He states his symptoms started 2 days ago. The first symptom he noticed was chills and general muscle aches. Later that same day he developed a sore throat and headache. He also reports tender and swollen lymph nodes near his throat. He rates his pain as a 10/10. He states he is still able to drink fluids without difficulty but is painful. He denies shortness of breath, cough, nausea, vomiting or abdominal pain  History reviewed. No pertinent past medical history. History reviewed. No pertinent past surgical history. History reviewed. No pertinent family history. History  Substance Use Topics  . Smoking status: Never Smoker   . Smokeless tobacco: Never Used  . Alcohol Use: No    Review of Systems  Constitutional: Positive for fever and chills.  HENT: Positive for sore throat.   Eyes: Negative for visual disturbance.  Respiratory: Negative for cough and shortness of breath.   Cardiovascular: Negative for chest pain and leg swelling.  Gastrointestinal: Negative for nausea, vomiting and diarrhea.  Genitourinary: Negative for dysuria.  Musculoskeletal: Positive for myalgias.  Skin: Negative for rash.  Neurological: Negative for weakness, numbness and headaches.      Allergies  Review of patient's allergies indicates no known allergies.  Home Medications   Prior to Admission medications   Medication Sig Start Date End Date Taking? Authorizing Provider  acetaminophen (TYLENOL) 500 MG tablet Take 500-1,000 mg by mouth every 6 (six) hours as needed for moderate pain or headache.    Historical Provider, MD   azithromycin (ZITHROMAX Z-PAK) 250 MG tablet Take 1 tablet (250 mg total) by mouth daily. 11/21/14   Elpidio AnisShari Upstill, PA-C  diphenhydrAMINE (SOMINEX) 25 MG tablet Take 50 mg by mouth daily as needed for allergies or sleep.    Historical Provider, MD  promethazine (PHENERGAN) 25 MG tablet Take 1 tablet (25 mg total) by mouth every 6 (six) hours as needed for nausea or vomiting. Patient not taking: Reported on 11/21/2014 09/19/13   Marlon Peliffany Greene, PA-C  traMADol (ULTRAM) 50 MG tablet Take 1 tablet (50 mg total) by mouth every 6 (six) hours as needed. Patient not taking: Reported on 11/21/2014 09/19/13   Marlon Peliffany Greene, PA-C   BP 121/57 mmHg  Pulse 93  Temp(Src) 101.7 F (38.7 C) (Oral)  Resp 18  Ht 5\' 8"  (1.727 m)  Wt 175 lb (79.379 kg)  BMI 26.61 kg/m2  SpO2 96% Physical Exam  Constitutional: He appears well-developed and well-nourished. No distress.  HENT:  Head: Normocephalic and atraumatic.  Mouth/Throat: No trismus in the jaw. No uvula swelling. Oropharyngeal exudate, posterior oropharyngeal edema and posterior oropharyngeal erythema present. No tonsillar abscesses.  Bilat tonsillar erythema, edema and exudate. No trismus, uvula deviation or abscess noted  Eyes: Conjunctivae are normal. Right eye exhibits no discharge. Left eye exhibits no discharge. No scleral icterus.  Cardiovascular: Intact distal pulses.   Pulmonary/Chest: Effort normal.  Neurological: He is alert. Coordination normal.  Skin: He is not diaphoretic.  Nursing note and vitals reviewed.   ED Course  Procedures (including critical care time) Labs Review Labs Reviewed - No data to display  Imaging Review No results found.   EKG  Interpretation None      MDM   Final diagnoses:  Strep pharyngitis   24 yo with fever, tonsillar adenopathy, exudate, cervical lymphadenopathy, & dysphagia. Clinically diagnosed with strep. He was treated in the ED with prednisone, NSAIDs, vicodin and PCN IM.  Discussed importance  of water rehydration. His presentation is not concerning for PTA or spread of infection to soft tissue. No trismus or uvula deviation noted. Specific return precautions discussed. Pt able to drink water in ED without difficulty with intact air way. Pt is well-appearing, in no acute distress and vital signs reviewed and not concerning. He appears safe to be discharged.  Discharge include follow-up with their PCP.  Return precautions provided. Pt aware of plan and in agreement.    Filed Vitals:   01/24/15 0453 01/24/15 0631  BP: 121/57 103/47  Pulse: 93 77  Temp: 101.7 F (38.7 C)   TempSrc: Oral   Resp: 18 18  Height: 5\' 8"  (1.727 m)   Weight: 175 lb (79.379 kg)   SpO2: 96% 98%   Meds given in ED:  Medications  penicillin g benzathine (BICILLIN LA) 1200000 UNIT/2ML injection 1.2 Million Units (1.2 Million Units Intramuscular Given 01/24/15 0540)  ibuprofen (ADVIL,MOTRIN) tablet 600 mg (600 mg Oral Given 01/24/15 0539)  HYDROcodone-acetaminophen (NORCO/VICODIN) 5-325 MG per tablet 1 tablet (1 tablet Oral Given 01/24/15 0540)  predniSONE (DELTASONE) tablet 60 mg (60 mg Oral Given 01/24/15 0539)  ondansetron (ZOFRAN-ODT) disintegrating tablet 8 mg (8 mg Oral Given 01/24/15 0538)    New Prescriptions   HYDROCODONE-ACETAMINOPHEN (NORCO/VICODIN) 5-325 MG PER TABLET    Take 1 tablet by mouth every 4 (four) hours as needed.   PREDNISONE (DELTASONE) 20 MG TABLET    Take 2 tablets (40 mg total) by mouth daily.       Harle BattiestElizabeth Angelik Walls, NP 01/24/15 1636  Marisa Severinlga Otter, MD 01/26/15 [phone]

## 2015-01-24 NOTE — Discharge Instructions (Signed)
Please follow the directions provided. Be sure to follow-up with your primary care doctor in a few days to make sure you're getting better. You may use the resource guide or the referral given to establish care with a primary care doctor. If you cannot get into see a primary care doctor he may come back to the emergency department in a few days to make sure you're getting better. You've been given an antibiotic in the emergency department. Please take the prednisone daily for 5 days to help with inflammation. You may take Tylenol every 4 hours for mild to moderate pain.  You may alternate with the the Vicodin every 4 hours as needed for more severe pain. Be sure to take either the Tylenol or the Vicodin, but do not take both together at the same time. Be sure to drink plenty of water to stay well hydrated. Don't hesitate to return for any new, worsening, or concerning symptoms.   SEEK IMMEDIATE MEDICAL CARE IF:  You develop any new symptoms such as vomiting, severe headache, stiff or painful neck, chest pain, shortness of breath, or trouble swallowing.  You develop severe throat pain, drooling, or changes in your voice.  You develop swelling of the neck, or the skin on the neck becomes red and tender.  You develop signs of dehydration, such as fatigue, dry mouth, and decreased urination.  You become increasingly sleepy, or you cannot wake up completely.    Emergency Department Resource Guide 1) Find a Doctor and Pay Out of Pocket Although you won't have to find out who is covered by your insurance plan, it is a good idea to ask around and get recommendations. You will then need to call the office and see if the doctor you have chosen will accept you as a new patient and what types of options they offer for patients who are self-pay. Some doctors offer discounts or will set up payment plans for their patients who do not have insurance, but you will need to ask so you aren't surprised when you get to  your appointment.  2) Contact Your Local Health Department Not all health departments have doctors that can see patients for sick visits, but many do, so it is worth a call to see if yours does. If you don't know where your local health department is, you can check in your phone book. The CDC also has a tool to help you locate your state's health department, and many state websites also have listings of all of their local health departments.  3) Find a Walk-in Clinic If your illness is not likely to be very severe or complicated, you may want to try a walk in clinic. These are popping up all over the country in pharmacies, drugstores, and shopping centers. They're usually staffed by nurse practitioners or physician assistants that have been trained to treat common illnesses and complaints. They're usually fairly quick and inexpensive. However, if you have serious medical issues or chronic medical problems, these are probably not your best option.  No Primary Care Doctor: - Call Health Connect at  [phone] - they can help you locate a primary care doctor that  accepts your insurance, provides certain services, etc. - Physician Referral Service- [phone]  Chronic Pain Problems: Organization         Address  Phone   Notes  [facility]  [phone]) [phone] Patients need to be referred by their primary care doctor.   Medication Assistance: Organization  Address  Phone   Notes  [facility] Medication [facility] [address] [place], [place] [address] [phone]) [phone] --Must be a resident of [place] [facility] [place] -- Must have NO insurance coverage whatsoever (no Medicaid/ Medicare, etc.) -- The pt. MUST have a primary care doctor that directs their care regularly and follows them in the community   MedAssist  [phone]) [phone]   [employer]  [phone]    Agencies that provide inexpensive medical care: Organization         Address  Phone    Notes  [facility]  [phone]) [phone]   [facility]    [phone]   [facility] [address] [place], [place] [address] [phone]) [phone]   [facility] [address], [place] [phone]) [phone]   Planned Parenthood    [phone]) [phone]   [facility]    [phone]   [facility]  [address], [place] Phone:  [phone], Fax:  [phone] Hours of Operation:  9 am - 6 pm, M-F.  Also accepts Medicaid/Medicare and self-pay.  [facility]  [address], [place] Phone: [phone], Fax: [phone]. Hours of Operation:  8:30 am - 5:30 pm, M-F.  Also accepts Medicaid and self-pay.  [facility] [place] [address], [place] Phone: [phone](336) [phone]   [facility] [address], [place], [place] [phone], Ext. [phone] Mondays & Thursdays: 7-9 AM.  First 15 patients are seen on a first come, first serve basis.    Medicaid-accepting [facility] Providers:  Organization         Address  Phone   Notes  [facility] [address], [place] [phone] Also accepts self-pay patients.  [facility] [address], [place]  [phone]) [phone]   [facility] [address], [place] [phone](336) [phone]   [facility] [address] [place] [address], [place] [phone]) [phone]   Renaye RakersVeita Bland [address], [place]   [phone] Only accepts [place] Access [place]icaid patients after they have their name applied to their card.   Self-Pay (no insurance) in [facility]:  Organization         Address  Phone   Notes  Sickle Cell Patients, [facility] [address], [place] [phone]) [phone]   [facility] [address], [place] [phone]   [facility] [place]  [address], [place] [phone]) [phone]   [facility]/Dr. Osei-Bonsu  [address] [place] [address], [place] or [phone], [place] [phone]) [phone] Phone number for both [place] and [place] locations is the same.  [facility] and [facility] [address], [place] [phone]) [phone]   [facility] [place] [address] [place] [address], [place] or [address] [phone]   [facility] [address], [place] [phone](336) [phone], phone; [phone]) [phone], fax Sees patients 1st and 3rd Saturday of every month.  Must not qualify for public or private insurance (i.e. Medicaid, Medicare, [place] Health Choice, Veterans' Benefits)  Household income should be no more than 200% of the poverty level The clinic cannot treat you if you are pregnant or think you  are pregnant  Sexually transmitted diseases are not treated at the clinic.    Dental Care: Organization         Address  Phone  Notes  [facility] Department of [facility] [address], [place] [phone]) [phone] Accepts children up to age 23 who are enrolled in [place]icaid or [place] Health Choice; pregnant women with a Medicaid card; and children who have applied for Medicaid or [place] Health Choice, but were declined, whose parents can pay a reduced fee at time of service.  [facility] of [facility] [place]  [address], [place] [phone]) [phone] Accepts children up to age 23 who are enrolled in [place]icaid or [place] Health Choice; pregnant women with a Medicaid card; and children who have applied for Medicaid or [place] Health Choice, but were declined, whose parents can pay a reduced fee at time of service.  [facility]  [address], [place] [phone]) [phone] Patients are seen by appointment only. Walk-ins are not accepted. [facility] will see patients 23 years of age  and older. Monday - Tuesday (8am-5pm) Most Wednesdays (8:30-5pm) $30 per visit, cash only  [facility]  [address], [facility] [phone]) [phone] Patients are seen by appointment only. Walk-ins are not accepted. [facility] will see patients 23 years of age and older. One Wednesday Evening (Monthly: Volunteer Based).  $30 per visit, cash only  [employer] [facility]  [phone] for adults; Children under age 554, call Graduate Pediatric Dentistry at [phone]. Children aged 234-14, please call [phone] to request a pediatric application.  Dental services are provided in all areas of dental care including fillings, crowns and bridges, complete and partial dentures, implants, gum treatment, root canals, and extractions. Preventive care is also provided. Treatment is provided to both adults and children. Patients are selected via a lottery and there is often a waiting list.   [facility] [address], [place]  [phone]) [phone] [email or web address]   [facility] [address], [place], [place] [phone], Ext. [phone] Second and Fourth Thursday of each month, opens at 6:30 AM; Clinic ends at 9 AM.  Patients are seen on a first-come first-served basis, and a limited number are seen during each clinic.   [facility]  [address], [place], [place] [phone]) [phone]   Eligibility Requirements You must have lived in [place], [place], or [place] counties for at least the last three months.   You cannot be eligible for state or federal sponsored National Cityhealthcare insurance, including CIGNAVeterans Administration, [place]icaid, or Har[employer] EntertainmentMedicare.   You generally cannot be eligible for healthcare insurance through your employer.    How to apply: Eligibility screenings are held every Tuesday and Wednesday afternoon from 1:00 pm until 4:00 pm. You do not need an appointment for the interview!  [facility] [address], [place], [place] [phone]   [facility]  [phone]   [facility]  [phone]   [facility]  [phone]    Behavioral Health Resources in the Community: Intensive Outpatient Programs Organization         Address  Phone  Notes  [facility] [address], [place], [place] [phone]   [facility] [address], [place], [place] [phone]   [facility]: [facility] Drug S[facility] [address], [place], [place]  [phone]   [facility] [address],  [place], [place] [phone] or [phone]   Substance Abuse Resources Organization         Address  Phone  Notes  [facility] Drug [facility]  [phone]   [facility]  [phone]   [facility]  [phone]   [facility]  [phone]   Residential & Outpatient Substance Abuse Program  [phone]   Psychological Services Organization         Address  Phone  Notes  [facility]  [phone]- [phone]   [facility]  [phone]- [phone]   [facility] [address], [place] [phone] or [phone]    Mobile Crisis Teams Organization         Address  Phone  Notes  [facility], Mobile Crisis Care Unit  [phone]   [facility]  [address] [place], [place] [phone]   Doristine LocksSharon DeEsch [address] [place] [phone]    Self-Help/Support Groups Organization         Address  Phone             Notes  [facility]ental Health Assoc. of [place] - variety of support groups  336- [phone] Call for more information  Narcotics Anonymous (NA), Caring Services [address], [place]  2 meetings at this location   Statisticianesidential Treatment Programs Organization         Address  Phone  Notes  [facility] [address],    [place]  [phone]   [facility]  [address], [place] [phone], [place], [place] [phone]   [facility] [address], [place] [phone] Admissions: 8am-3pm M-F  [facility] [address]    [place], [place] [phone]   [facility] [address], [place], [place] [phone]   [facility] [address]  [place], [place] [phone]   [facility] - Intensive Outpatient [address], [place], [place] [phone]   [facility] (Addiction Recovery [facility] [address]  [place], [place] [phone] or [phone]   [facility] Services (RTS) [address] [place], [place] [phone] Accepts Medicaid  [facility] [address]  [place]. [place] [phone] Substance Abuse/Addiction Treatment   [facility]al Health Resources Organization         Address  Phone  Notes  [facility]  [phone]   Angie FavaJulie Brannon, PhD [address], [address] [place], [place]   [phone]) [phone] or [phone]) [phone]   [facility]   [address] [place], [place] [phone](336) [phone]   [facility] [address], [place], [place] [phone](336) [phone] Insurance/Medicaid/sponsorship through [facility]  Faith and Families [address]                                    [place], [place] [phone]) [phone] Therapy/tele-psych/case  [facility] [address].   [place], [place] [phone]) [phone]    Dr. Lolly MustacheArfeen  [phone](336) [phone]   [facility] [place]  [employer] [facility] 1) [address], [place] 2) [address], [place] 3)  [address], [place] [phone]) [phone]) [phone]  [phone](336) [phone]   [facility] Child Abuse Hotline [phone](336)  [phone] or [phone] (After Hours)    ° ° ° °

## 2015-01-24 NOTE — ED Notes (Signed)
Pt reports severe sore throat with fever x 2 days.

## 2015-01-24 NOTE — ED Notes (Signed)
Pt states he has a fever and sore throat that started 2 days ago Pt states he took tylenol last night for fever

## 2015-03-06 ENCOUNTER — Emergency Department (HOSPITAL_COMMUNITY): Payer: No Typology Code available for payment source

## 2015-03-06 ENCOUNTER — Emergency Department (HOSPITAL_COMMUNITY)
Admission: EM | Admit: 2015-03-06 | Discharge: 2015-03-06 | Disposition: A | Discharge status: Home / Self Care | Payer: No Typology Code available for payment source | Attending: Emergency Medicine | Admitting: Emergency Medicine

## 2015-03-06 ENCOUNTER — Encounter (HOSPITAL_COMMUNITY): Payer: Self-pay | Admitting: Emergency Medicine

## 2015-03-06 DIAGNOSIS — R51 Headache: Secondary | ICD-10-CM

## 2015-03-06 DIAGNOSIS — M5412 Radiculopathy, cervical region: Secondary | ICD-10-CM | POA: Insufficient documentation

## 2015-03-06 DIAGNOSIS — F1210 Cannabis abuse, uncomplicated: Secondary | ICD-10-CM | POA: Diagnosis not present

## 2015-03-06 DIAGNOSIS — S0990XA Unspecified injury of head, initial encounter: Secondary | ICD-10-CM | POA: Insufficient documentation

## 2015-03-06 DIAGNOSIS — Y9389 Activity, other specified: Secondary | ICD-10-CM | POA: Diagnosis not present

## 2015-03-06 DIAGNOSIS — Y92410 Unspecified street and highway as the place of occurrence of the external cause: Secondary | ICD-10-CM | POA: Diagnosis not present

## 2015-03-06 DIAGNOSIS — Y998 Other external cause status: Secondary | ICD-10-CM | POA: Diagnosis not present

## 2015-03-06 DIAGNOSIS — S199XXA Unspecified injury of neck, initial encounter: Secondary | ICD-10-CM | POA: Diagnosis present

## 2015-03-06 DIAGNOSIS — M542 Cervicalgia: Secondary | ICD-10-CM

## 2015-03-06 DIAGNOSIS — R519 Headache, unspecified: Secondary | ICD-10-CM

## 2015-03-06 DIAGNOSIS — F1110 Opioid abuse, uncomplicated: Secondary | ICD-10-CM | POA: Diagnosis not present

## 2015-03-06 LAB — URINALYSIS, ROUTINE W REFLEX MICROSCOPIC
BILIRUBIN URINE: NEGATIVE
GLUCOSE, UA: NEGATIVE mg/dL
Hgb urine dipstick: NEGATIVE
KETONES UR: NEGATIVE mg/dL
Nitrite: NEGATIVE
Protein, ur: NEGATIVE mg/dL
Specific Gravity, Urine: 1.026 (ref 1.005–1.030)
Urobilinogen, UA: 0.2 mg/dL (ref 0.0–1.0)
pH: 6 (ref 5.0–8.0)

## 2015-03-06 LAB — RAPID URINE DRUG SCREEN, HOSP PERFORMED
Amphetamines: NOT DETECTED
BARBITURATES: NOT DETECTED
BENZODIAZEPINES: NOT DETECTED
Cocaine: NOT DETECTED
Opiates: POSITIVE — AB
TETRAHYDROCANNABINOL: POSITIVE — AB

## 2015-03-06 LAB — URINE MICROSCOPIC-ADD ON

## 2015-03-06 MED ORDER — KETOROLAC TROMETHAMINE 60 MG/2ML IM SOLN
60.0000 mg | Freq: Once | INTRAMUSCULAR | Status: AC
  Administered 2015-03-06: 60 mg via INTRAMUSCULAR
  Filled 2015-03-06: qty 2

## 2015-03-06 MED ORDER — IBUPROFEN 800 MG PO TABS: 800.0000 mg | ORAL_TABLET | Freq: Three times a day (TID) | ORAL | Status: DC

## 2015-03-06 MED ORDER — METHOCARBAMOL 500 MG PO TABS: 500.0000 mg | ORAL_TABLET | Freq: Two times a day (BID) | ORAL | Status: DC | PRN

## 2015-03-06 MED ORDER — PREDNISONE 20 MG PO TABS: 40.0000 mg | ORAL_TABLET | Freq: Every day | ORAL | Status: DC
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
## 2015-06-03 ENCOUNTER — Emergency Department (HOSPITAL_COMMUNITY)
Admission: EM | Admit: 2015-06-03 | Discharge: 2015-06-03 | Disposition: A | Discharge status: Home / Self Care | Payer: No Typology Code available for payment source | Attending: Emergency Medicine | Admitting: Emergency Medicine

## 2015-06-03 ENCOUNTER — Encounter (HOSPITAL_COMMUNITY): Payer: Self-pay | Admitting: Emergency Medicine

## 2015-06-03 DIAGNOSIS — Z7952 Long term (current) use of systemic steroids: Secondary | ICD-10-CM | POA: Diagnosis not present

## 2015-06-03 DIAGNOSIS — M542 Cervicalgia: Secondary | ICD-10-CM

## 2015-06-03 DIAGNOSIS — Z791 Long term (current) use of non-steroidal anti-inflammatories (NSAID): Secondary | ICD-10-CM | POA: Insufficient documentation

## 2015-06-03 MED ORDER — DIAZEPAM 5 MG PO TABS
5.0000 mg | ORAL_TABLET | Freq: Once | ORAL | Status: AC
  Administered 2015-06-03: 5 mg via ORAL
  Filled 2015-06-03: qty 1

## 2015-06-03 MED ORDER — KETOROLAC TROMETHAMINE 60 MG/2ML IM SOLN
60.0000 mg | Freq: Once | INTRAMUSCULAR | Status: AC
  Administered 2015-06-03: 60 mg via INTRAMUSCULAR
  Filled 2015-06-03: qty 2

## 2015-06-03 MED ORDER — METHOCARBAMOL 500 MG PO TABS: 500.0000 mg | ORAL_TABLET | Freq: Two times a day (BID) | ORAL | Status: DC | PRN

## 2015-06-03 MED ORDER — PREDNISONE 20 MG PO TABS: 40.0000 mg | ORAL_TABLET | Freq: Every day | ORAL | Status: DC

## 2015-06-03 MED ORDER — OXYCODONE-ACETAMINOPHEN 5-325 MG PO TABS
2.0000 | ORAL_TABLET | Freq: Once | ORAL | Status: AC
  Administered 2015-06-03: 2 via ORAL
  Filled 2015-06-03: qty 2

## 2015-06-03 MED ORDER — OXYCODONE-ACETAMINOPHEN 5-325 MG PO TABS
2.0000 | ORAL_TABLET | ORAL | Status: DC | PRN
Start: 2015-06-03 — End: 2016-01-07

## 2015-06-03 NOTE — Discharge Instructions (Signed)
Pinched Nerve °The term pinched nerve describes one type of damage or injury to a nerve or set of nerves. Pinched nerves can sometimes lead to other conditions. These include peripheral neuropathy, carpal tunnel syndrome, and tennis elbow. The extent of such injuries may vary from minor, temporary damage to a more permanent condition. Early diagnosis is important to prevent further damage or complications. Pinched nerve is a common cause of on-the-job injury. °CAUSES  °The injury may result from: °· Compression. °· Constriction. °· Stretching. °SYMPTOMS  °Symptoms include: °· Numbness. °· "Pins and needles" or burning sensations. °· Pain radiating outward from the injured area. °· One of the most common examples of a single compressed nerve is the feeling of having a foot or hand "fall asleep." °TREATMENT  °The most often recommended treatment for pinched nerve is rest for the affected area. Corticosteroids help alleviate pain. In some cases, surgery is recommended. Physical therapy may be recommended. Splints or collars may be used. °With treatment, most people recover from pinched nerve. In some cases, the damage is irreversible. °Document Released: 08/15/2002 Document Revised: 11/17/2011 Document Reviewed: 08/02/2008 °Exit[employer]® Patient Information ©2015 [employer]. This information is not intended to replace advice given to you by your health care provider. Make sure you discuss any questions you have with your health care provider. ° °
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
## 2015-06-03 NOTE — ED Notes (Addendum)
MD at bedside.   9:20 AM Assessment completed and charted on flowsheets.   Heat packs provided and warm blankets. Visitor given water. Very pleasant

## 2016-01-07 ENCOUNTER — Emergency Department (HOSPITAL_COMMUNITY): Payer: Self-pay

## 2016-01-07 ENCOUNTER — Emergency Department (HOSPITAL_COMMUNITY)
Admission: EM | Admit: 2016-01-07 | Discharge: 2016-01-07 | Disposition: A | Discharge status: Home / Self Care | Payer: Self-pay | Attending: Emergency Medicine | Admitting: Emergency Medicine

## 2016-01-07 ENCOUNTER — Encounter (HOSPITAL_COMMUNITY): Payer: Self-pay

## 2016-01-07 DIAGNOSIS — Z791 Long term (current) use of non-steroidal anti-inflammatories (NSAID): Secondary | ICD-10-CM | POA: Insufficient documentation

## 2016-01-07 DIAGNOSIS — S62605A Fracture of unspecified phalanx of left ring finger, initial encounter for closed fracture: Secondary | ICD-10-CM

## 2016-01-07 DIAGNOSIS — Y93A1 Activity, exercise machines primarily for cardiorespiratory conditioning: Secondary | ICD-10-CM | POA: Insufficient documentation

## 2016-01-07 DIAGNOSIS — Z792 Long term (current) use of antibiotics: Secondary | ICD-10-CM | POA: Insufficient documentation

## 2016-01-07 DIAGNOSIS — Z79891 Long term (current) use of opiate analgesic: Secondary | ICD-10-CM | POA: Insufficient documentation

## 2016-01-07 DIAGNOSIS — S62615A Displaced fracture of proximal phalanx of left ring finger, initial encounter for closed fracture: Secondary | ICD-10-CM | POA: Insufficient documentation

## 2016-01-07 DIAGNOSIS — Z7952 Long term (current) use of systemic steroids: Secondary | ICD-10-CM | POA: Insufficient documentation

## 2016-01-07 DIAGNOSIS — W208XXA Other cause of strike by thrown, projected or falling object, initial encounter: Secondary | ICD-10-CM | POA: Insufficient documentation

## 2016-01-07 DIAGNOSIS — Z79899 Other long term (current) drug therapy: Secondary | ICD-10-CM | POA: Insufficient documentation

## 2016-01-07 DIAGNOSIS — S62303A Unspecified fracture of third metacarpal bone, left hand, initial encounter for closed fracture: Secondary | ICD-10-CM | POA: Insufficient documentation

## 2016-01-07 DIAGNOSIS — Y92008 Other place in unspecified non-institutional (private) residence as the place of occurrence of the external cause: Secondary | ICD-10-CM | POA: Insufficient documentation

## 2016-01-07 DIAGNOSIS — Y998 Other external cause status: Secondary | ICD-10-CM | POA: Insufficient documentation

## 2016-01-07 DIAGNOSIS — S62309A Unspecified fracture of unspecified metacarpal bone, initial encounter for closed fracture: Secondary | ICD-10-CM

## 2016-01-07 MED ORDER — OXYCODONE-ACETAMINOPHEN 5-325 MG PO TABS: 1.0000 | ORAL_TABLET | ORAL | Status: DC | PRN

## 2016-01-07 MED ORDER — OXYCODONE-ACETAMINOPHEN 5-325 MG PO TABS
2.0000 | ORAL_TABLET | Freq: Once | ORAL | Status: AC
  Administered 2016-01-07: 2 via ORAL
  Filled 2016-01-07: qty 2

## 2016-01-07 NOTE — Discharge Instructions (Signed)
Take the prescribed medication as directed.  Use caution-- percocet can make you sleepy/drowsy.  Do not recommend to drive while taking this. Follow-up with Dr. Melvyn Novasrtmann (hand surgeon)-- call his office to schedule follow-up appt. Leave splint in place until repeat evaluation. Return to the ED for new or worsening symptoms.

## 2016-01-07 NOTE — ED Notes (Signed)
Patient was doing squats and dropped a 490 LB bar bell on his left arm. This AM.

## 2016-01-07 NOTE — ED Notes (Signed)
Patient returned from X-ray 

## 2016-01-07 NOTE — ED Notes (Signed)
Sanders at bedside and aware of pt response to pain.

## 2016-01-07 NOTE — ED Notes (Signed)
Patient transported to X-ray 

## 2016-01-07 NOTE — ED Provider Notes (Signed)
CSN: [id number]     Arrival date & time 01/07/16  0722 History   First MD Initiated Contact with Patient 01/07/16 [phone]     Chief Complaint  Patient presents with  . Arm Injury     (Consider location/radiation/quality/duration/timing/severity/associated sxs/prior Treatment) Patient is a 25 y.o. male presenting with arm injury. The history is provided by the patient and medical records.  Arm Injury  25 year old male here with left hand pain. He and his girlfriend were exercising in her home gym. He was doing squats with 495 pounds on a barbell when it slipped and fell onto his left hand/wrist. Reports immediate onset of pain and swelling. He reports he does have some paresthesias in his left hand. Denies numbness or weakness. Patient is right-hand dominant. No prior injuries or surgeries to the left hand. No intervention tried prior to arrival. VSS.  History reviewed. No pertinent past medical history. History reviewed. No pertinent past surgical history. History reviewed. No pertinent family history. Social History  Substance Use Topics  . Smoking status: Never Smoker   . Smokeless tobacco: Never Used  . Alcohol Use: No    Review of Systems  Musculoskeletal: Positive for arthralgias.  All other systems reviewed and are negative.     Allergies  Review of patient's allergies indicates no known allergies.  Home Medications   Prior to Admission medications   Medication Sig Start Date End Date Taking? Authorizing Provider  azithromycin (ZITHROMAX Z-PAK) 250 MG tablet Take 1 tablet (250 mg total) by mouth daily. Patient not taking: Reported on 01/24/2015 11/21/14   Elpidio AnisShari Upstill, PA-C  HYDROcodone-acetaminophen (NORCO/VICODIN) 5-325 MG per tablet Take 1 tablet by mouth every 4 (four) hours as needed. Patient not taking: Reported on 06/03/2015 01/24/15   Harle BattiestElizabeth Tysinger, NP  ibuprofen (ADVIL,MOTRIN) 200 MG tablet Take 200 mg by mouth every 6 (six) hours as needed for mild pain.     Historical Provider, MD  ibuprofen (ADVIL,MOTRIN) 800 MG tablet Take 1 tablet (800 mg total) by mouth 3 (three) times daily. Patient not taking: Reported on 06/03/2015 03/06/15   Danelle BerryLeisa Tapia, PA-C  methocarbamol (ROBAXIN) 500 MG tablet Take 1 tablet (500 mg total) by mouth 2 (two) times daily as needed for muscle spasms. 06/03/15   Lorre NickAnthony Allen, MD  oxyCODONE-acetaminophen (PERCOCET/ROXICET) 5-325 MG per tablet Take 2 tablets by mouth every 4 (four) hours as needed for severe pain. 06/03/15   Lorre NickAnthony Allen, MD  predniSONE (DELTASONE) 20 MG tablet Take 2 tablets (40 mg total) by mouth daily. 06/03/15   Lorre NickAnthony Allen, MD  promethazine (PHENERGAN) 25 MG tablet Take 1 tablet (25 mg total) by mouth every 6 (six) hours as needed for nausea or vomiting. Patient not taking: Reported on 11/21/2014 09/19/13   Marlon Peliffany Greene, PA-C  traMADol (ULTRAM) 50 MG tablet Take 1 tablet (50 mg total) by mouth every 6 (six) hours as needed. Patient not taking: Reported on 11/21/2014 09/19/13   Marlon Peliffany Greene, PA-C   BP 95/69 mmHg  Pulse 65  Temp(Src) 97.9 F (36.6 C) (Oral)  Resp 20  Ht 5\' 8"  (1.727 m)  Wt 83.915 kg  BMI 28.14 kg/m2  SpO2 100%   Physical Exam  Constitutional: He is oriented to person, place, and time. He appears well-developed and well-nourished. No distress.  HENT:  Head: Normocephalic and atraumatic.  Mouth/Throat: Oropharynx is clear and moist.  Eyes: Conjunctivae and EOM are normal. Pupils are equal, round, and reactive to light.  Neck: Normal range of motion. Neck supple.  Cardiovascular: Normal rate, regular rhythm and normal heart sounds.   Pulmonary/Chest: Effort normal and breath sounds normal. No respiratory distress. He has no wheezes.  Musculoskeletal:       Left hand: He exhibits decreased range of motion, tenderness and bony tenderness. He exhibits normal capillary refill. Normal sensation noted. Normal strength noted.  Left hand with generalized tenderness across distal dorsal hand  near MCP joints; no open wounds or lacerations; no significant deformities noted; limited ROM of fingers due to pain; normal sensation throughout; strong radial pulse palpated, normal cap refill; compartments remain soft, easily compressible  Neurological: He is alert and oriented to person, place, and time.  Skin: Skin is warm and dry. He is not diaphoretic.  Psychiatric: He has a normal mood and affect.  Nursing note and vitals reviewed.   ED Course  ORTHOPEDIC INJURY TREATMENT Date/Time: 01/07/2016 9:20 AM Performed by: Garlon Hatchet Authorized by: Garlon Hatchet Consent: Verbal consent obtained. Risks and benefits: risks, benefits and alternatives were discussed Consent given by: patient Patient understanding: patient states understanding of the procedure being performed Required items: required blood products, implants, devices, and special equipment available Patient identity confirmed: verbally with patient Injury location: hand Location details: left hand Injury type: fracture Fracture type: third metacarpal Pre-procedure neurovascular assessment: neurovascularly intact Immobilization: splint Supplies used: cotton padding,  elastic bandage and Ortho-Glass Post-procedure neurovascular assessment: post-procedure neurovascularly intact Patient tolerance: Patient tolerated the procedure well with no immediate complications   (including critical care time)   Labs Review Labs Reviewed - No data to display  Imaging Review Dg Wrist Complete Left  01/07/2016  CLINICAL DATA:  The patient dropped heavy weight onto the left hand this morning. Pain. Initial encounter. EXAM: LEFT WRIST - COMPLETE 3+ VIEW COMPARISON:  None. FINDINGS: Fractures of the third metacarpal and proximal phalanx of the ring finger are seen as on dedicated plain films of the hand. No other acute bony or joint abnormality is identified. Soft tissues are unremarkable. IMPRESSION: Fractures of the proximal phalanx  of the ring finger and third metacarpal as seen on plain films of the hand. No other acute abnormality. Electronically Signed   By: Drusilla Kanner M.D.   On: 01/07/2016 07:59   Dg Hand Complete Left  01/07/2016  CLINICAL DATA:  The patient dropped a heavy weight on to his left hand this morning with onset of pain. Initial encounter. EXAM: LEFT HAND - COMPLETE 3+ VIEW COMPARISON:  None. FINDINGS: The patient has a nondisplaced transverse fracture of the distal diaphysis of the third metacarpal. There is also a slightly oblique fracture through the proximal diaphysis of the proximal phalanx of the ring finger. This fracture shows radial displacement of 0.4 cm. A component of the fracture radiates into the metaphysis but the articular surface does not appear disrupted. No other fracture is identified. No radiopaque foreign body is seen. IMPRESSION: Fractures of the proximal phalanx of the ring finger and third metacarpal open left hand as described above. Electronically Signed   By: Drusilla Kanner M.D.   On: 01/07/2016 07:57   I have personally reviewed and evaluated these images and lab results as part of my medical decision-making.   EKG Interpretation None      MDM   Final diagnoses:  Closed fracture of phalanx of left ring finger, initial encounter  Metacarpal bone fracture, closed, initial encounter   25 y.o. M here with left hand pain s/p crush injury from 495 lb barbell.  Tenderness noted to  dorsal left hand, worse near MCP joints.  No significant deformities on exam.  Compartments remain soft, easily compressible.  Radial pulse remains intact with good capillary refill.  No signs of compartment syndrome at this time.  X-rays reveal fractures at proximal phalanx of left ring finger as well as fracture of 3rd metacarpal.  Patient placed in modified ulnar gutter splint to extend across the 3rd metacarpal.  Will be referred to hand surgery for follow-up.  Rx percocet for home.  Discussed plan  with patient, he/she acknowledged understanding and agreed with plan of care.  Return precautions given for new or worsening symptoms.  Garlon Hatchet, PA-C 01/07/16 1116  Cathren Laine, MD 01/08/16 [phone]

## 2016-01-10 ENCOUNTER — Other Ambulatory Visit: Payer: Self-pay | Admitting: Orthopedic Surgery

## 2016-01-11 ENCOUNTER — Encounter (HOSPITAL_COMMUNITY): Payer: Self-pay | Admitting: *Deleted

## 2016-01-11 NOTE — Progress Notes (Signed)
Abbreviation on consent verified with Cordelia PenSherry at Dr.Gramig's office and also with Dennie BiblePat in the OR.

## 2016-01-12 ENCOUNTER — Ambulatory Visit (HOSPITAL_COMMUNITY): Payer: Self-pay | Admitting: Certified Registered"

## 2016-01-12 ENCOUNTER — Ambulatory Visit (HOSPITAL_COMMUNITY)
Admission: RE | Admit: 2016-01-12 | Discharge: 2016-01-12 | Disposition: A | Discharge status: Home / Self Care | Payer: Self-pay | Source: Ambulatory Visit | Attending: Orthopedic Surgery | Admitting: Orthopedic Surgery

## 2016-01-12 ENCOUNTER — Encounter (HOSPITAL_COMMUNITY)
Admission: RE | Disposition: A | Discharge status: Home / Self Care | Payer: Self-pay | Source: Ambulatory Visit | Attending: Orthopedic Surgery

## 2016-01-12 ENCOUNTER — Encounter (HOSPITAL_COMMUNITY): Payer: Self-pay | Admitting: *Deleted

## 2016-01-12 DIAGNOSIS — X58XXXA Exposure to other specified factors, initial encounter: Secondary | ICD-10-CM | POA: Insufficient documentation

## 2016-01-12 DIAGNOSIS — S62615A Displaced fracture of proximal phalanx of left ring finger, initial encounter for closed fracture: Secondary | ICD-10-CM | POA: Insufficient documentation

## 2016-01-12 DIAGNOSIS — S62303A Unspecified fracture of third metacarpal bone, left hand, initial encounter for closed fracture: Secondary | ICD-10-CM | POA: Insufficient documentation

## 2016-01-12 HISTORY — PX: OPEN REDUCTION INTERNAL FIXATION (ORIF) METACARPAL: SHX6234

## 2016-01-12 SURGERY — OPEN REDUCTION INTERNAL FIXATION (ORIF) METACARPAL
Anesthesia: Regional | Site: Finger | Laterality: Left

## 2016-01-12 MED ORDER — FENTANYL CITRATE (PF) 100 MCG/2ML IJ SOLN
INTRAMUSCULAR | Status: AC
  Administered 2016-01-12: 50 ug via INTRAVENOUS
  Filled 2016-01-12: qty 2

## 2016-01-12 MED ORDER — BUPIVACAINE HCL (PF) 0.25 % IJ SOLN
INTRAMUSCULAR | Status: AC
  Filled 2016-01-12: qty 30

## 2016-01-12 MED ORDER — CHLORHEXIDINE GLUCONATE 4 % EX LIQD: 60.0000 mL | Freq: Once | CUTANEOUS | Status: DC

## 2016-01-12 MED ORDER — MIDAZOLAM HCL 2 MG/2ML IJ SOLN
INTRAMUSCULAR | Status: AC
  Administered 2016-01-12: 2 mg via INTRAVENOUS
  Filled 2016-01-12: qty 2

## 2016-01-12 MED ORDER — ONDANSETRON HCL 4 MG/2ML IJ SOLN
INTRAMUSCULAR | Status: DC | PRN
  Administered 2016-01-12: 4 mg via INTRAVENOUS

## 2016-01-12 MED ORDER — BUPIVACAINE-EPINEPHRINE (PF) 0.5% -1:200000 IJ SOLN
INTRAMUSCULAR | Status: DC | PRN
  Administered 2016-01-12: 30 mL via PERINEURAL

## 2016-01-12 MED ORDER — LACTATED RINGERS IV SOLN
INTRAVENOUS | Status: DC
  Administered 2016-01-12 (×3): via INTRAVENOUS

## 2016-01-12 MED ORDER — 0.9 % SODIUM CHLORIDE (POUR BTL) OPTIME
TOPICAL | Status: DC | PRN
  Administered 2016-01-12: 1000 mL

## 2016-01-12 MED ORDER — PROPOFOL 500 MG/50ML IV EMUL
INTRAVENOUS | Status: DC | PRN
  Administered 2016-01-12: 75 ug/kg/min via INTRAVENOUS

## 2016-01-12 MED ORDER — MIDAZOLAM HCL 2 MG/2ML IJ SOLN
INTRAMUSCULAR | Status: AC
  Filled 2016-01-12: qty 2

## 2016-01-12 MED ORDER — CEFAZOLIN SODIUM-DEXTROSE 2-4 GM/100ML-% IV SOLN
INTRAVENOUS | Status: AC
  Filled 2016-01-12: qty 100

## 2016-01-12 MED ORDER — CEFAZOLIN SODIUM-DEXTROSE 2-4 GM/100ML-% IV SOLN
2.0000 g | INTRAVENOUS | Status: AC
  Administered 2016-01-12: 2 g via INTRAVENOUS

## 2016-01-12 MED ORDER — DEXAMETHASONE SODIUM PHOSPHATE 10 MG/ML IJ SOLN
INTRAMUSCULAR | Status: DC | PRN
  Administered 2016-01-12: 10 mg via INTRAVENOUS

## 2016-01-12 MED ORDER — FENTANYL CITRATE (PF) 250 MCG/5ML IJ SOLN
INTRAMUSCULAR | Status: AC
  Filled 2016-01-12: qty 5

## 2016-01-12 MED ORDER — PROPOFOL 10 MG/ML IV BOLUS
INTRAVENOUS | Status: DC | PRN
  Administered 2016-01-12 (×2): 10 mg via INTRAVENOUS
  Administered 2016-01-12: 20 mg via INTRAVENOUS
  Administered 2016-01-12: 10 mg via INTRAVENOUS
  Administered 2016-01-12: 250 mg via INTRAVENOUS
  Administered 2016-01-12: 30 mg via INTRAVENOUS

## 2016-01-12 MED ORDER — LIDOCAINE HCL (CARDIAC) 20 MG/ML IV SOLN
INTRAVENOUS | Status: DC | PRN
  Administered 2016-01-12: 50 mg via INTRATRACHEAL

## 2016-01-12 MED ORDER — LIDOCAINE-EPINEPHRINE 2 %-1:100000 IJ SOLN
INTRAMUSCULAR | Status: DC | PRN
  Administered 2016-01-12: 10 mL via PERINEURAL

## 2016-01-12 SURGICAL SUPPLY — 76 items
1.1 DRILL BIT IMPLANT
BANDAGE ACE 3X5.8 VEL STRL LF (GAUZE/BANDAGES/DRESSINGS) ×3 IMPLANT
BANDAGE ELASTIC 3 VELCRO ST LF (GAUZE/BANDAGES/DRESSINGS) IMPLANT
BANDAGE ELASTIC 4 VELCRO ST LF (GAUZE/BANDAGES/DRESSINGS) ×3 IMPLANT
BIT DRILL 1.1X3.5 MINI-AO (BIT) ×4
BIT DRILL 1.1X3.5 QR MINI-AO (BIT) ×2 IMPLANT
BIT DRILL 2X3.5 HAND QK RELEAS (BIT) ×1 IMPLANT
BIT DRILL 2X3.5 QUICK RELEASE (BIT) ×3
BLADE 15 SAFETY STRL DISP (BLADE) ×3 IMPLANT
BLADE SURG ROTATE 9660 (MISCELLANEOUS) IMPLANT
BNDG CONFORM 2 STRL LF (GAUZE/BANDAGES/DRESSINGS) ×3 IMPLANT
BNDG ESMARK 4X9 LF (GAUZE/BANDAGES/DRESSINGS) ×3 IMPLANT
BNDG GAUZE ELAST 4 BULKY (GAUZE/BANDAGES/DRESSINGS) ×3 IMPLANT
CORDS BIPOLAR (ELECTRODE) ×3 IMPLANT
COVER SURGICAL LIGHT HANDLE (MISCELLANEOUS) ×3 IMPLANT
CUFF TOURNIQUET SINGLE 18IN (TOURNIQUET CUFF) ×3 IMPLANT
CUFF TOURNIQUET SINGLE 24IN (TOURNIQUET CUFF) IMPLANT
DRAIN TLS ROUND 10FR (DRAIN) IMPLANT
DRAPE OEC MINIVIEW 54X84 (DRAPES) ×3 IMPLANT
DRAPE SURG 17X23 STRL (DRAPES) ×3 IMPLANT
DRSG ADAPTIC 3X8 NADH LF (GAUZE/BANDAGES/DRESSINGS) ×3 IMPLANT
GAUZE SPONGE 4X4 12PLY STRL (GAUZE/BANDAGES/DRESSINGS) ×3 IMPLANT
GAUZE XEROFORM 1X8 LF (GAUZE/BANDAGES/DRESSINGS) IMPLANT
GAUZE XEROFORM 5X9 LF (GAUZE/BANDAGES/DRESSINGS) ×3 IMPLANT
GLOVE BIOGEL M 8.0 STRL (GLOVE) IMPLANT
GLOVE SS BIOGEL STRL SZ 8 (GLOVE) ×1 IMPLANT
GLOVE SUPERSENSE BIOGEL SZ 8 (GLOVE) ×2
GOWN STRL REUS W/ TWL LRG LVL3 (GOWN DISPOSABLE) ×2 IMPLANT
GOWN STRL REUS W/ TWL XL LVL3 (GOWN DISPOSABLE) ×1 IMPLANT
GOWN STRL REUS W/TWL LRG LVL3 (GOWN DISPOSABLE) ×6
GOWN STRL REUS W/TWL XL LVL3 (GOWN DISPOSABLE) ×3
KIT BASIN OR (CUSTOM PROCEDURE TRAY) ×3 IMPLANT
KIT ROOM TURNOVER OR (KITS) ×3 IMPLANT
MANIFOLD NEPTUNE II (INSTRUMENTS) ×3 IMPLANT
NEEDLE 22X1 1/2 (OR ONLY) (NEEDLE) IMPLANT
NS IRRIG 1000ML POUR BTL (IV SOLUTION) ×3 IMPLANT
PACK ORTHO EXTREMITY (CUSTOM PROCEDURE TRAY) ×3 IMPLANT
PAD ARMBOARD 7.5X6 YLW CONV (MISCELLANEOUS) ×6 IMPLANT
PAD CAST 3X4 CTTN HI CHSV (CAST SUPPLIES) ×1 IMPLANT
PAD CAST 4YDX4 CTTN HI CHSV (CAST SUPPLIES) ×1 IMPLANT
PADDING CAST COTTON 3X4 STRL (CAST SUPPLIES) ×3
PADDING CAST COTTON 4X4 STRL (CAST SUPPLIES) ×2
PLATE 1.3 T-SHAPED (Plate) ×6 IMPLANT
SCREW BONE LAG 2.3X12MM HEXA (Screw) ×1 IMPLANT
SCREW BONE LAG 2.3X8MM HEXA (Screw) ×1 IMPLANT
SCREW BONE LAG 2.3X9MM HEXAQ (Screw) ×1 IMPLANT
SCREW BONE MD 1.5X11MM HEXA (Screw) ×1 IMPLANT
SCREW BONE MD 1.5X14MM HEXA (Screw) ×2 IMPLANT
SCREW LAG 2.3X12MM (Screw) ×3 IMPLANT
SCREW LAG 2.3X8MM (Screw) ×3 IMPLANT
SCREW LAG 2.3X9MM (Screw) ×3 IMPLANT
SCREW LOCK HEX MULTI 2.3X11 (Screw) ×12 IMPLANT
SCREW LOCKING 1.5X8MM (Screw) ×6 IMPLANT
SCREW MD 1.5C12MM (Screw) ×3 IMPLANT
SCREW MD 1.5X11MM (Screw) ×3 IMPLANT
SCREW MD 1.5X14MM (Screw) ×6 IMPLANT
SCREW NONLOCK 2.3X13MM (Screw) ×6 IMPLANT
SPLINT FIBERGLASS 4X30 (CAST SUPPLIES) ×3 IMPLANT
SPONGE LAP 4X18 X RAY DECT (DISPOSABLE) IMPLANT
SUCTION FRAZIER HANDLE 10FR (MISCELLANEOUS) ×2
SUCTION TUBE FRAZIER 10FR DISP (MISCELLANEOUS) ×1 IMPLANT
SUT CHROMIC 5 0 P 3 (SUTURE) ×3 IMPLANT
SUT FIBERWIRE 4-0 18 TAPR NDL (SUTURE) ×3
SUT MNCRL AB 4-0 PS2 18 (SUTURE) IMPLANT
SUT PROLENE 3 0 PS 2 (SUTURE) IMPLANT
SUT PROLENE 4 0 PS 2 18 (SUTURE) ×9 IMPLANT
SUT VIC AB 3-0 FS2 27 (SUTURE) IMPLANT
SUTURE FIBERWR 4-0 18 TAPR NDL (SUTURE) ×1 IMPLANT
SYR CONTROL 10ML LL (SYRINGE) IMPLANT
SYSTEM CHEST DRAIN TLS 7FR (DRAIN) IMPLANT
TOWEL OR 17X24 6PK STRL BLUE (TOWEL DISPOSABLE) ×3 IMPLANT
TOWEL OR 17X26 10 PK STRL BLUE (TOWEL DISPOSABLE) ×3 IMPLANT
TUBE CONNECTING 12'X1/4 (SUCTIONS) ×1
TUBE CONNECTING 12X1/4 (SUCTIONS) ×2 IMPLANT
TUBE EVACUATION TLS (MISCELLANEOUS) ×3 IMPLANT
WATER STERILE IRR 1000ML POUR (IV SOLUTION) ×3 IMPLANT

## 2016-01-12 NOTE — Progress Notes (Signed)
Orthopedic Tech Progress Note Patient Details:  [facility]OBERT [facility] 12/27/1990 [id number]  Ortho Devices Type of Ortho Device: Arm sling Ortho Device/Splint Interventions: Application   Saul FordyceJennifer C Zamiya Dillard 01/12/2016, 6:49 PM

## 2016-01-12 NOTE — Anesthesia Procedure Notes (Addendum)
Anesthesia Regional Block:  Axillary brachial plexus block  Pre-Anesthetic Checklist: ,, timeout performed, Correct Patient, Correct Site, Correct Laterality, Correct Procedure, Correct Position, site marked, Risks and benefits discussed, Surgical consent,  Pre-op evaluation,  Post-op pain management  Laterality: Left  Prep: chloraprep       Needles:  Injection technique: Single-shot  Needle Type: Stimulator Needle - 40     Needle Length: 4cm 4 cm Needle Gauge: 22 and 22 G    Additional Needles:  Procedures: ultrasound guided (picture in chart) Axillary brachial plexus block Narrative:  Injection made incrementally with aspirations every 5 mL. Anesthesiologist: Lewie LoronGERMEROTH, JOHN  Additional Notes: BP cuff, EKG monitors applied. Sedation begun. Nerve location verified with U/S. Anesthetic injected incrementally, slowly , and after neg aspirations under direct u/s guidance. Good perineural spread. Tolerated well.   Procedure Name: LMA Insertion Date/Time: 01/12/2016 2:42 PM Performed by: Alanda AmassFRIEDMAN, Jerlene Rockers A Pre-anesthesia Checklist: Patient identified, Emergency Drugs available, Suction available, Patient being monitored and Timeout performed Patient Re-evaluated:Patient Re-evaluated prior to inductionOxygen Delivery Method: Circle System Utilized and Circle system utilized Preoxygenation: Pre-oxygenation with 100% oxygen Intubation Type: IV induction LMA: LMA inserted LMA Size: 5.0 Number of attempts: 1 Placement Confirmation: positive ETCO2 Tube secured with: Tape Dental Injury: Teeth and Oropharynx as per pre-operative assessment

## 2016-01-12 NOTE — Anesthesia Postprocedure Evaluation (Signed)
Anesthesia Post Note  [facility]  Procedure(s) Performed: Procedure(s) (LRB): OPEN REDUCTION INTERNAL FIXATION (ORIF) LEFT RING FINGER AND LEFT THIRD METACARPAL (Left)  Patient location during evaluation: PACU Anesthesia Type: General and Regional Level of consciousness: sedated and patient cooperative Pain management: pain level controlled Vital Signs Assessment: post-procedure vital signs reviewed and stable Respiratory status: spontaneous breathing Cardiovascular status: stable Anesthetic complications: no    Last Vitals:  Filed Vitals:   01/12/16 1645 01/12/16 1700  BP: 108/66 110/76  Pulse: 62 61  Temp:  36.6 C  Resp: 16 15    Last Pain:  Filed Vitals:   01/12/16 1827  PainSc: 0-No pain                 Lewie LoronJohn Onya [facility]

## 2016-01-12 NOTE — Discharge Instructions (Signed)

## 2016-01-12 NOTE — Op Note (Signed)
See ZOXWRUEAV#[id number]#[id number] Amanda PeaGramig MD

## 2016-01-12 NOTE — H&P (Signed)
Jason Campbell [facility] is an 25 y.o. male.   Chief Complaint: Third metacarpal fracture left hand, displaced ring finger proximal phalanx left hand HPI: Patient presents for ORIF left hand status post injury with a large weight  I discussed him all issues risks and benefits. We're plan to proceed with open reduction internal fixation and restorative geometric patterns about his hand and wrist  History reviewed. No pertinent past medical history.  History reviewed. No pertinent past surgical history.  History reviewed. No pertinent family history. Social History:  reports that he has never smoked. He has never used smokeless tobacco. He reports that he drinks alcohol. He reports that he does not use illicit drugs.  Allergies: No Known Allergies  Medications Prior to Admission  Medication Sig Dispense Refill  . Ascorbic Acid (VITAMIN C) 1000 MG tablet Take 1,000 mg by mouth daily.    . cephALEXin (KEFLEX) 500 MG capsule Take 500 mg by mouth 4 (four) times daily.    Marland Kitchen OVER THE COUNTER MEDICATION Take 2 capsules by mouth daily as needed (allergies). allergiclear S    . oxyCODONE-acetaminophen (PERCOCET/ROXICET) 5-325 MG tablet Take 1 tablet by mouth every 4 (four) hours as needed. 20 tablet 0  . ibuprofen (ADVIL,MOTRIN) 200 MG tablet Take 800 mg by mouth every 6 (six) hours as needed for headache or mild pain.     Marland Kitchen ibuprofen (ADVIL,MOTRIN) 800 MG tablet Take 1 tablet (800 mg total) by mouth 3 (three) times daily. (Patient taking differently: Take 800 mg by mouth daily as needed for headache. ) 21 tablet 0    No results found for this or any previous visit (from the past 48 hour(s)). No results found.  Review of Systems  Constitutional: Negative.   Eyes: Negative.   Respiratory: Negative.   Cardiovascular: Negative.   Gastrointestinal: Negative.   Genitourinary: Negative.   Endo/Heme/Allergies: Negative.     Blood pressure 106/60, pulse 76, temperature 98 F (36.7 C), temperature  source Oral, resp. rate 16, height  (1.727 m), weight 83.915 kg (185 lb), SpO2 100 %. Physical Exam pleasant male alert and oriented he has displaced fracture about the ring finger with loss of alignment and disarray the soft tissues. He is swelling. Metacarpal fracture about the third metacarpal left hand is stable.  I've discussed with him all issues risk benefits these don'ts.  The patient is alert and oriented in no acute distress. The patient complains of pain in the affected upper extremity.  The patient is noted to have a normal HEENT exam. Lung fields show equal chest expansion and no shortness of breath. Abdomen exam is nontender without distention. Lower extremity examination does not show any fracture dislocation or blood clot symptoms. Pelvis is stable and the neck and back are stable and nontender. Assessment/Plan We'll plan for open reduction internal fixation left ring finger proximal phalanx fracture and left middle finger metacarpal fracture.  Is going to be imperative given him moving quickly given the severity of his fracture and the abnormalities were implant for fixation of both areas and early range of motion and a total active fashion. I discussed the patient the issues do's and don'ts and risk and benefits and he desires to proceed.  We are planning surgery for your upper extremity. The risk and benefits of surgery to include risk of bleeding, infection, anesthesia,  damage to normal structures and failure of the surgery to accomplish its intended goals of relieving symptoms and restoring function have been discussed in detail. With  this in mind we plan to proceed. I have specifically discussed with the patient the pre-and postoperative regime and the dos and don'ts and risk and benefits in great detail. Risk and benefits of surgery also include risk of dystrophy(CRPS), chronic nerve pain, failure of the healing process to go onto completion and other inherent risks of  surgery The relavent the pathophysiology of the disease/injury process, as well as the alternatives for treatment and postoperative course of action has been discussed in great detail with the patient who desires to proceed.  We will do everything in our power to help you (the patient) restore function to the upper extremity. It is a pleasure to see this patient today.   Karen ChafeGRAMIG III,Derrian Rodak M, MD 01/12/2016, 1:24 PM

## 2016-01-12 NOTE — Anesthesia Preprocedure Evaluation (Signed)
Anesthesia Evaluation  Patient identified by MRN, date of birth, ID band Patient awake    Reviewed: Allergy & Precautions, NPO status , Patient's Chart, lab work & pertinent test results  Airway Mallampati: II  TM Distance: >3 FB Neck ROM: Full    Dental no notable dental hx.    Pulmonary neg pulmonary ROS,    Pulmonary exam normal breath sounds clear to auscultation       Cardiovascular negative cardio ROS Normal cardiovascular exam Rhythm:Regular Rate:Normal     Neuro/Psych negative neurological ROS  negative psych ROS   GI/Hepatic negative GI ROS, Neg liver ROS,   Endo/Other  negative endocrine ROS  Renal/GU negative Renal ROS     Musculoskeletal negative musculoskeletal ROS (+)   Abdominal   Peds  Hematology negative hematology ROS (+)   Anesthesia Other Findings   Reproductive/Obstetrics                             Anesthesia Physical Anesthesia Plan  ASA: I  Anesthesia Plan: Regional   Post-op Pain Management:    Induction: Intravenous  Airway Management Planned:   Additional Equipment:   Intra-op Plan:   Post-operative Plan:   Informed Consent: I have reviewed the patients History and Physical, chart, labs and discussed the procedure including the risks, benefits and alternatives for the proposed anesthesia with the patient or authorized representative who has indicated his/her understanding and acceptance.   Dental advisory given  Plan Discussed with: CRNA  Anesthesia Plan Comments:         Anesthesia Quick Evaluation

## 2016-01-12 NOTE — Transfer of Care (Signed)
Immediate Anesthesia Transfer of Care Note  Patient: Jason DibbleROBERT Anthony [facility]  Procedure(s) Performed: Procedure(s): OPEN REDUCTION INTERNAL FIXATION (ORIF) LEFT RING FINGER AND LEFT THIRD METACARPAL (Left)  Patient Location: PACU  Anesthesia Type:General  Level of Consciousness: sedated  Airway & Oxygen Therapy: Patient Spontanous Breathing and Patient connected to nasal cannula oxygen  Post-op Assessment: Report given to RN and Post -op Vital signs reviewed and stable  Post vital signs: Reviewed and stable  Last Vitals:  Filed Vitals:   01/12/16 1259  BP: 106/60  Pulse: 76  Temp: 36.7 C  Resp: 16    Last Pain:  Filed Vitals:   01/12/16 1301  PainSc: 5       Patients Stated Pain Goal: 7 (01/11/16 0911)  Complications: No apparent anesthesia complications

## 2016-01-12 NOTE — Progress Notes (Signed)
Patient and family/ friend at bedside made aware of delay from earlier OR time.

## 2016-01-14 ENCOUNTER — Encounter (HOSPITAL_COMMUNITY): Payer: Self-pay | Admitting: Orthopedic Surgery

## 2016-01-14 NOTE — Op Note (Signed)
NAME:  Jason Campbell, [name]                  ACCOUNT NO.:  MEDICAL RECORD NO.:  [id number]  LOCATION:                                 FACILITY:  PHYSICIAN:  Dionne AnoWilliam M. Jeni Duling, M.D.DATE OF BIRTH:  04-Oct-1990  DATE OF PROCEDURE: DATE OF DISCHARGE:                              OPERATIVE REPORT   PREOPERATIVE DIAGNOSES: 1. Left ring finger, proximal phalanx fracture, displaced. 2. Left 3rd metacarpal fracture, mild displacement.  POSTOPERATIVE DIAGNOSES: 1. Left ring finger, proximal phalanx fracture, displaced. 2. Left 3rd metacarpal fracture, mild displacement.  PROCEDURE: 1. Open reduction and internal fixation with a 1.3 Acumed plate and     screw construct, left middle finger 3rd metacarpal. 2. Open reduction and internal fixation proximal phalanx fracture with     a T-plate 1.3 mm from the Acumed set, left ring finger P1. 3. AP, lateral, and oblique x-rays performed, examined, interpreted by     myself, left hand. 4. Extensor tenolysis, tenosynovectomy, left ring finger.  SURGEON:  Dionne AnoWilliam M. Amanda PeaGramig, MD  ASSISTANT:  Karie ChimeraBrian Buchanan, PA-C  COMPLICATIONS:  None.  ANESTHESIA:  LMA general preoperative block.  TOURNIQUET TIME:  Less than 90 minutes.  DRAINS:  None.  INDICATIONS:  A 25 year old male with the above-mentioned diagnosis. Given the significant disarray of his soft tissue after a nearly 500 pound object fell onto his hand and the displacement of his ring finger as well as comminution, we have recommended the above mentioned measures.  I feel that to try to give him the best and possible and one that he is going to be happy with we need to get firm fixation, again moving quickly to prevent adhesions which could be a major battle in my opinion.  I have discussed with him risks and benefits, do's and don'ts, timeframe, duration of recovery.  With this in mind, we will proceed accordingly.  All questions have been encouraged and answered  and addressed.  OPERATIVE PROCEDURE:  The patient was seen by myself and Anesthesia, taken to the operative theater and underwent smooth induction of LMA general anesthetic.  He was prepped and draped with Hibiclens pre-scrub about left upper extremity followed by 10 minutes surgical Betadine scrub and paint.  Once this was complete, time-out was observed and operation commenced with a dorsal radial incision about the ring finger, left hand, proximal phalanx region.  Incision was made.  Extensor was identified and underwent a tenolysis, tenosynovectomy as there was large amount of fluid and bloody abnormality secondary to the displacement of the fracture which was the most unstable fracture of the left hand.  I performed tenolysis, tenosynovectomy and made a midline splint and then incised the periosteum away from the midline split, folded it back and reduced the fracture and applied a 1.3 Acumed plate with combination of 2.3 and 1.5 mm screws, combination of nonlocking followed by locking screws were placed.  Excellent fixation was obtained.  Reduction was anatomic and all looked well.  I closed the periosteum after AP, lateral, and oblique x-rays, confirmed excellent reduction with chromic suture.  AP lateral and oblique x-rays were performed, examined, and interpreted by myself looked to be excellent.  Once we closed  periosteum, I then repaired the split in the extensor apparatus with FiberWire and closed the wound with Prolene.  Following this, attention was turned towards the 3rd metacarpal. Incision was made dorsally.  A combination of blunt and soft dissection was carried down the extensor apparatus.  I used the midline split in the [facility] and preserve as much of the juncturae as possible.  Following this, the patient then underwent a very careful and cautious approach to the fracture.  The fracture was reduced and a 1.3 mm Acumed plate was placed, combination of 2.3 and 1.5  screws were placed.  I had excellent purchase.  Standard AO technique was used for purposes of placement of the screws and ORIF of course.  Following this, we irrigated, closed the periosteum with chromic. Extensor split was closed with FiberWire.  Wound edge was closed with Prolene.  A long splint was applied at the finger tip.  He tolerated this well.  He had a good block preoperatively, it appeared.  I will allow him to go home.  Antibiotics and his general postop pain management.  These notes have been discussed and all questions have been encouraged and answered.  Pleasure to see him today and participate in his care plan.  Should any problems occur, he will notify us.     Dionne Ano. Amanda Pea, M.D.     [facility]  D:  01/12/2016  T:  01/13/2016  Job:  [id number]

## 2016-01-15 ENCOUNTER — Encounter (HOSPITAL_COMMUNITY): Payer: Self-pay | Admitting: Orthopedic Surgery

## 2016-01-23 ENCOUNTER — Ambulatory Visit: Payer: Self-pay | Attending: Orthopedic Surgery | Admitting: Occupational Therapy

## 2016-01-23 DIAGNOSIS — M6281 Muscle weakness (generalized): Secondary | ICD-10-CM | POA: Insufficient documentation

## 2016-01-23 DIAGNOSIS — M79642 Pain in left hand: Secondary | ICD-10-CM | POA: Insufficient documentation

## 2016-01-23 DIAGNOSIS — M25642 Stiffness of left hand, not elsewhere classified: Secondary | ICD-10-CM | POA: Insufficient documentation

## 2016-01-23 NOTE — Patient Instructions (Signed)
WEARING SCHEDULE:  Wear splint at ALL times except for hygiene care (May remove splint for exercises and then immediately place back on ONLY if directed by the therapist)  PURPOSE:  To prevent movement and for protection until injury can heal  CARE OF SPLINT:  Keep splint away from heat sources including: stove, radiator or furnace, or a car in sunlight. The splint can melt and will no longer fit you properly  Keep away from pets and children  Clean the splint with rubbing alcohol 1-2 times per day.  * During this time, make sure you also clean your hand/arm as instructed by your therapist and/or perform dressing changes as needed. Then dry hand/arm completely before replacing splint. (When cleaning hand/arm, keep it immobilized in same position until splint is replaced)  PRECAUTIONS/POTENTIAL PROBLEMS: *If you notice or experience increased pain, swelling, numbness, or a lingering reddened area from the splint: Contact your therapist immediately by calling [phone]. You must wear the splint for protection, but we will get you scheduled for adjustments as quickly as possible.  (If only straps or hooks need to be replaced and NO adjustments to the splint need to be made, just call the office ahead and let them know you are coming in)  If you have any medical concerns or signs of infection, please call your doctor immediately           AROM: Wrist Extension   .  With left____ palm down, bend wrist up. Repeat __15__ times per set.  Do __4-6__ sessions per day.       AROM: Forearm Pronation / Supination   With left____ arm in handshake position, slowly rotate palm down until stretch is felt. Relax. Then rotate palm up until stretch is felt. Repeat _15___ times per set. Do _4-6___ sessions per day.  Copyright  VHI. All rights reserved.     MP Flexion (Active Isolated)   Bend __each____ finger at large knuckle, keeping other fingers straight. Do not bend tips. Repeat  _10-15___ times. Do __4-6__ sessions per day.  AROM: PIP Flexion / Extension   Pinch bottom knuckle of _each_______ finger of hand to prevent bending. Actively bend middle knuckle until stretch is felt. Hold __5__ seconds. Relax. Straighten finger as far as possible. Repeat __10-15__ times per set. Do _4-6___ sessions per day.   AROM: DIP Flexion / Extension   Pinch middle knuckle of __each______ finger of  hand to prevent bending. Bend end knuckle until stretch is felt. Hold _5___ seconds. Relax. Straighten finger as far as possible. Repeat _10-15___ times per set.  Do _4-6___ sessions per day.  AROM: Finger Flexion / Extension   Actively bend fingers of  hand. Start with knuckles furthest from palm, and slowly make a fist. Hold __5__ seconds. Relax. Then straighten fingers as far as possible. Repeat _10-15___ times per set.  Do _4-6___ sessions per day.  Copyright  VHI. All rights reserved.

## 2016-01-23 NOTE — Therapy (Signed)
[facility] [address] [place], [place], [address] Phone: [phone]   Fax:  [phone]  Occupational Therapy Evaluation  Patient Details  Name: Jason Campbell [facility] MRN: [id number] Date of Birth: 16-Sep-1990 Referring Provider: Dr. Amanda Pea  Encounter Date: 01/23/2016      OT End of Session - 01/23/16 1052    Visit Number 1   Number of Visits 25   Date for OT Re-Evaluation 04/24/16   Authorization Type self pay goals written for 12 weeks, anticipate d/c earlier depending on progress   OT Start Time 0855   OT Stop Time 1015   OT Time Calculation (min) 80 min   Activity Tolerance Patient limited by pain   Behavior During Therapy [facility] for tasks assessed/performed      No past medical history on file.  Past Surgical History  Procedure Laterality Date  . Open reduction internal fixation (orif) metacarpal Left 01/12/2016    Procedure: OPEN REDUCTION INTERNAL FIXATION (ORIF) LEFT RING FINGER AND LEFT THIRD METACARPAL;  Surgeon: Dominica Severin, MD;  Location: [facility] OR;  Service: Orthopedics;  Laterality: Left;    There were no vitals filed for this visit.      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   Pain Score 8    Pain Location Hand   Pain Orientation Left   Pain Descriptors / Indicators Sharp   Pain Type Acute pain   Pain Onset 1 to 4 weeks ago   Pain Frequency Constant   Aggravating Factors  movement   Pain Relieving Factors inactivity, meds   Multiple Pain Sites No           [facility] OT Assessment - 01/23/16 0001    Assessment   Diagnosis s/p ORIF 3rd left MC fx, and ORIF 4th proximal phalanx fx left ring finger   Referring Provider Dr. Amanda Pea   Onset Date 01/12/16   Precautions   Precautions Other  (comment)   Precaution Comments cleared for a/ROM, splint when not exercising   Home  Environment   Family/patient expects to be discharged to: Private residence   Lives With Significant other   Prior Function   Level of Independence Independent   Vocation Full time employment   Ecologist   ADL   ADL comments Pt is performing ADLs with his right dominant hand   Mobility   Mobility Status Independent   Written Expression   Dominant Hand Right   Coordination   Fine Motor Movements are Fluid and Coordinated No   ROM / Strength   AROM / PROM / Strength AROM;Strength   AROM   Overall AROM  Deficits;Due to precautions;Due to pain   Overall AROM Comments Pt demonstrates limited overall A/ROm in LUE. wrist flexion/ extension: 65/ 50   Strength   Overall Strength Deficits;Unable to assess;Due to precautions   Right Hand AROM   R Index  MCP 0-90 40 Degrees   R Index PIP 0-100 60 Degrees  -10 ext   R Index DIP 0-70 45 Degrees   R Long  MCP 0-90 35 Degrees  -20 ext   R Long PIP 0-100 65 Degrees  -10 ext   R Long DIP 0-70 40 Degrees   R Ring  MCP 0-90  35 Degrees  -10 ext   R Ring PIP 0-100 50 Degrees  -15 ext   R Ring DIP 0-70 45 Degrees   R Little  MCP 0-90 40 Degrees   R Little PIP 0-100 75 Degrees   R Little DIP 0-70 55 Degrees                  OT Treatments/Exercises (OP) - 01/23/16 0001    Splinting   Splinting Pt arrived wearing protective wrap only. Pt reports that MD removed stitches today. Pt was fittted with a volar extension splint including all digits with wrist in splint extension and MP/ IP joints in 10-15 flexion(Pt was unable to tolerate 0* extension today.Therapist to modifiy splint in future visits as pt is able to tolerate. Pt was instructed in inital A/ROM HEP and splint wear, care and precautions. He verbalized understanding.(Pt was tearful and in significant pain. Pt took a pain pill with some relief).   incisions were  clean/dry,dressed with stockinette, and gauze               OT Education - 01/23/16 1342    Education provided Yes   Education Details splint wear, care and precuations. Initial A/ROM HEP.   Person(s) Educated Patient;Other (comment)  girlfriend   Methods Explanation;Demonstration;Tactile cues;Verbal cues;Handout   Comprehension Verbalized understanding;Returned demonstration;Verbal cues required          OT Short Term Goals - 01/23/16 1050    OT SHORT TERM GOAL #1   Title I with splint wear, care and precautions following 1-2 weeks of wear to ensure proper fit.(check 03/07/16)   Time 6   Period Weeks   Status New   OT SHORT TERM GOAL #2   Title I with inital HEP.   Time 6   Period Weeks   Status New   OT SHORT TERM GOAL #3   Title Pt will increase MP flexion for all digits to 70* for increased functional use.   Baseline 40, 35, 35, 40   Time 6   Period Weeks   Status New   OT SHORT TERM GOAL #4   Title Pt will increase PIP flexion for all digits by 15* for increased functional use.   Baseline 60, 65, 50, 70   Time 6   Period Weeks   Status New   OT SHORT TERM GOAL #5   Title Pt will demonstrate MP extension and PIP extension for all digits at -5 or better.   Time 6   Period Weeks   Status New           OT Long Term Goals - 01/23/16 1057    OT LONG TERM GOAL #1   Title Pt will resume use of LUE as a non dominant assist at least 90% of the time for ADLs/IADLS. with pain less than or equal to 3/10.-check 04/24/16   Time 12   Period Weeks   Status New   OT LONG TERM GOAL #2   Title Pt will demonstrate LUE grip strength of at least 40 lbs. for increased functional use.   Time 12   Period Weeks   Status New   OT LONG TERM GOAL #3   Title Pt will demonstrate at least 95% composite finger flexion for LUE.   Time 12   Period Weeks   Status New   OT LONG TERM GOAL #4   Title Pt will perform simulated work activities at a modified indpendent level.  Time 12   Period Weeks   Status New               Plan - 01/23/16 1046    Clinical Impression Statement Pt s/p ORIF to 3rd left MC fx, and ORIF to 4th proximal phalanx fx ring finger presents with pain, decreased a/ROM and decreased strength which impedes performance of ADLS/IADLs. 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 Pt can benefit from skillled ocupational therapy to maximize indpendence with ADLs/ IADLs, and resume prior level of function.   Rehab Potential Good   OT Frequency 2x / week  plus eval, will d/c earlier if pt progresses well   OT Duration 12 weeks   OT Treatment/Interventions Self-care/ADL training;Therapeutic exercise;Patient/family education;Splinting;Neuromuscular education;Ultrasound;Cryotherapy;Parrafin;DME and/or AE instruction;Therapeutic activities;Therapeutic exercises;Electrical Stimulation;Scar mobilization;Moist Heat;Contrast Bath;Passive range of motion   Plan splint chack/ modifiications, reinforce HEP/   Consulted and Agree with Plan of Care Patient;Family member/caregiver   Family Member Consulted girlfriend      Patient will benefit from skilled therapeutic intervention in order to improve the following deficits and impairments:  Decreased coordination, Decreased range of motion, Increased edema, Decreased endurance, Decreased activity tolerance, Pain, Impaired UE functional use, Decreased strength  Visit Diagnosis: Stiffness of left hand, not elsewhere classified - Plan: Ot plan of care cert/re-cert  Pain in left hand - Plan: Ot plan of care cert/re-cert  Muscle weakness (generalized) - Plan: Ot plan of care cert/re-cert    Problem List There are no active problems to display for this patient.   RINE,KATHRYN 01/23/2016, 1:48 PM Keene BreathKathryn Rine, OTR/L [phone] Phone: [phone] 1:48 PM 01/23/2016 [facility] [address] [place], [place],  [address] Phone: [phone]   Fax:  [phone]  Name: Jason Campbell MRN: [id number] Date of Birth: 03/30/1991

## 2016-01-24 ENCOUNTER — Ambulatory Visit: Payer: Self-pay | Admitting: Occupational Therapy

## 2016-01-24 DIAGNOSIS — M25642 Stiffness of left hand, not elsewhere classified: Secondary | ICD-10-CM

## 2016-01-24 DIAGNOSIS — M79642 Pain in left hand: Secondary | ICD-10-CM

## 2016-01-24 DIAGNOSIS — M6281 Muscle weakness (generalized): Secondary | ICD-10-CM

## 2016-01-25 NOTE — Therapy (Signed)
[facility] [address] [place], [place], [address] Phone: [phone]   Fax:  [phone]  Occupational Therapy Treatment  Patient Details  Name: Jason Campbell MRN: [id number] Date of Birth: 09/16/1990 Referring Provider: Dr. Amanda PeaGramig  Encounter Date: 01/24/2016      OT End of Session - 01/25/16 1035    Visit Number 2   Number of Visits 25   Date for OT Re-Evaluation 04/24/16   Authorization Type self pay goals written for 12 weeks, anticipate d/c earlier depending on progress   OT Start Time 1404   OT Stop Time 1455   OT Time Calculation (min) 51 min   Activity Tolerance Patient tolerated treatment well   Behavior During Therapy [facility] for tasks assessed/performed      No past medical history on file.  Past Surgical History  Procedure Laterality Date  . Open reduction internal fixation (orif) metacarpal Left 01/12/2016    Procedure: OPEN REDUCTION INTERNAL FIXATION (ORIF) LEFT RING FINGER AND LEFT THIRD METACARPAL;  Surgeon: Dominica SeverinWilliam Gramig, MD;  Location: [facility] OR;  Service: Orthopedics;  Laterality: Left;    There were no vitals filed for this visit.      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   Currently in Pain? Yes   Pain Score 4    Pain Location Hand   Pain Orientation Left   Pain Descriptors / Indicators Sharp   Pain Type Acute pain   Pain Onset 1 to 4 weeks ago   Aggravating Factors  movement   Pain Relieving Factors inactivity/ meds   Multiple Pain Sites No       Pt arrived wearing splint. Hand was cleaned with soap and water and dried thoroughly. Finger and hand were dressed with stockinette and tensogrip. Hotpack applied to left hand x10 mins  while therapist modified splint into nearly full extension for  MP's and IP's . (no adverse reactions) Splint fits well with modifications and pt demonstrates improved finger extension. Therapist reviewed A/ROM HEP with pt/ girlfriend. A/ROM composite flexion, MP flexion , and IP flexion as well as PIP and DIP blocking(to ring finger) x 10 reps each Ice pack applied at end of session for edema contol/ pain relief  x 8 mins.                           OT Short Term Goals - 01/23/16 1050    OT SHORT TERM GOAL #1   Title I with splint wear, care and precautions following 1-2 weeks of wear to ensure proper fit.(check 03/07/16)   Time 6   Period Weeks   Status New   OT SHORT TERM GOAL #2   Title I with inital HEP.   Time 6   Period Weeks   Status New   OT SHORT TERM GOAL #3   Title Pt will increase MP flexion for all digits to 70* for increased functional use.   Baseline 40, 35, 35, 40   Time 6   Period Weeks   Status New   OT SHORT TERM GOAL #4   Title Pt will increase PIP flexion for all digits by 15* for increased functional use.   Baseline 60, 65, 50, 70  Time 6   Period Weeks   Status New   OT SHORT TERM GOAL #5   Title Pt will demonstrate MP extension and PIP extension for all digits at -5 or better.   Time 6   Period Weeks   Status New           OT Long Term Goals - 01/23/16 1057    OT LONG TERM GOAL #1   Title Pt will resume use of LUE as a non dominant assist at least 90% of the time for ADLs/IADLS. with pain less than or equal to 3/10.-check 04/24/16   Time 12   Period Weeks   Status New   OT LONG TERM GOAL #2   Title Pt will demonstrate LUE grip strength of at least 40 lbs. for increased functional use.   Time 12   Period Weeks   Status New   OT LONG TERM GOAL #3   Title Pt will demonstrate at least 95% composite finger flexion for LUE.   Time 12   Period Weeks   Status New   OT LONG TERM GOAL #4   Title Pt will perform simulated work activities at a modified indpendent level.   Time 12    Period Weeks   Status New               Plan - 01/25/16 1033    Clinical Impression Statement Pt demonstrates improved A/OM and pain was better controlled today. Therapist was able to adjust splint into nearly full MP and IP extension.   Rehab Potential Good   OT Frequency 2x / week   OT Duration 12 weeks   OT Treatment/Interventions Self-care/ADL training;Therapeutic exercise;Patient/family education;Splinting;Neuromuscular education;Ultrasound;Cryotherapy;Parrafin;DME and/or AE instruction;Therapeutic activities;Therapeutic exercises;Electrical Stimulation;Scar mobilization;Moist Heat;Contrast Bath;Passive range of motion   Plan A/ROM, modailities, pt sees MD next friday, send note to clarify when pt can perform P/ROM, strengthening.   Consulted and Agree with Plan of Care Patient;Family member/caregiver   Family Member Consulted girlfriend      Patient will benefit from skilled therapeutic intervention in order to improve the following deficits and impairments:  Decreased coordination, Decreased range of motion, Increased edema, Decreased endurance, Decreased activity tolerance, Pain, Impaired UE functional use, Decreased strength  Visit Diagnosis: Stiffness of left hand, not elsewhere classified  Pain in left hand  Muscle weakness (generalized)    Problem List There are no active problems to display for this patient.   Jason Campbell 01/25/2016, 10:49 AM Keene Breath, OTR/L [phone] Phone: [phone] 10:49 AM 01/25/2016 [facility] [address] [place], [place], [address] Phone: [phone]   Fax:  [phone]  Name: Jason Campbell MRN: [id number] Date of Birth: 09-21-1990

## 2016-01-28 ENCOUNTER — Ambulatory Visit: Payer: Self-pay | Admitting: Occupational Therapy

## 2016-01-29 ENCOUNTER — Ambulatory Visit: Payer: Self-pay | Admitting: Occupational Therapy

## 2016-01-29 DIAGNOSIS — M25642 Stiffness of left hand, not elsewhere classified: Secondary | ICD-10-CM

## 2016-01-29 DIAGNOSIS — M6281 Muscle weakness (generalized): Secondary | ICD-10-CM

## 2016-01-29 DIAGNOSIS — M79642 Pain in left hand: Secondary | ICD-10-CM

## 2016-01-29 NOTE — Therapy (Signed)
[facility] [address] [place], [place], [address] Phone: [phone]   Fax:  [phone]  Occupational Therapy Treatment  Patient Details  Name: Jason DibbleROBERT [facility] MRN: [id number] Date of Birth: 12/04/1990 Referring Provider: Dr. Amanda PeaGramig  Encounter Date: 01/29/2016      OT End of Session - 01/29/16 1254    Visit Number 3   Number of Visits 25   Date for OT Re-Evaluation 04/24/16   Authorization Type self pay goals written for 12 weeks, anticipate d/c earlier depending on progress   OT Start Time 1152   OT Stop Time 1256   OT Time Calculation (min) 64 min   Activity Tolerance Patient tolerated treatment well   Behavior [facility] Therapy [facility] for tasks assessed/performed      No past medical history on file.  Past Surgical History  Procedure Laterality Date  . Open reduction internal fixation (orif) metacarpal Left 01/12/2016    Procedure: OPEN REDUCTION INTERNAL FIXATION (ORIF) LEFT RING FINGER AND LEFT THIRD METACARPAL;  Surgeon: Dominica SeverinWilliam Gramig, MD;  Location: [facility] OR;  Service: Orthopedics;  Laterality: Left;    There were no vitals filed for this visit.          Pt arrived wearing splint.  Finger and hand were dressed with stockinette and tensogrip. Hotpack applied to left hand x10 mins  while therapist cleaned and added new straps to splint (no adverse reactions)  A/ROM composite flexion, MP flexion , and IP flexion as well as PIP and DIP blocking(to ring finger, middle finger) x 10 reps each Ultasound to palm and digits 3,4 for edema control and scar mobilization, 3mhz, 0.8 w/cm 2, 50 %, x 8 mins. No adverse reactions NMES to finger flexors x 10 mins 50 pps, 250 pw, 10 sec cycle, intensity 13 with pt performing A/ROM duing on cycle. Ice pack applied at end of session for edema contol/ pain relief  x 8 mins.                     OT Short Term Goals - 01/23/16 1050    OT SHORT TERM GOAL  #1   Title I with splint wear, care and precautions following 1-2 weeks of wear to ensure proper fit.(check 03/07/16)   Time 6   Period Weeks   Status New   OT SHORT TERM GOAL #2   Title I with inital HEP.   Time 6   Period Weeks   Status New   OT SHORT TERM GOAL #3   Title Pt will increase MP flexion for all digits to 70* for increased functional use.   Baseline 40, 35, 35, 40   Time 6   Period Weeks   Status New   OT SHORT TERM GOAL #4   Title Pt will increase PIP flexion for all digits by 15* for increased functional use.   Baseline 60, 65, 50, 70   Time 6   Period Weeks   Status New   OT SHORT TERM GOAL #5   Title Pt will demonstrate MP extension and PIP extension for all digits at -5 or better.   Time 6   Period Weeks   Status New           OT Long Term Goals - 01/23/16 1057    OT LONG TERM GOAL #1   Title Pt will resume use of LUE as a non dominant assist at least 90% of the time for ADLs/IADLS. with pain  less than or equal to 3/10.-check 04/24/16   Time 12   Period Weeks   Status New   OT LONG TERM GOAL #2   Title Pt will demonstrate LUE grip strength of at least 40 lbs. for increased functional use.   Time 12   Period Weeks   Status New   OT LONG TERM GOAL #3   Title Pt will demonstrate at least 95% composite finger flexion for LUE.   Time 12   Period Weeks   Status New   OT LONG TERM GOAL #4   Title Pt will perform simulated work activities at a modified indpendent level.   Time 12   Period Weeks   Status New               Plan - 01/29/16 1244    Clinical Impression Statement Pt is progressing towards goals for A/ROM. Note sent with pt to give to  MD. since he sees him Friday.   Rehab Potential Good   OT Frequency 2x / week   OT Duration 12 weeks   OT Treatment/Interventions Self-care/ADL training;Therapeutic exercise;Patient/family education;Splinting;Neuromuscular education;Ultrasound;Cryotherapy;Parrafin;DME and/or AE  instruction;Therapeutic activities;Therapeutic exercises;Electrical Stimulation;Scar mobilization;Moist Heat;Contrast Bath;Passive range of motion   Plan A/ROM, modalities,    Consulted and Agree with Plan of Care Patient;Family member/caregiver      Patient will benefit from skilled therapeutic intervention in order to improve the following deficits and impairments:  Decreased coordination, Decreased range of motion, Increased edema, Decreased endurance, Decreased activity tolerance, Pain, Impaired UE functional use, Decreased strength  Visit Diagnosis: Stiffness of left hand, not elsewhere classified  Pain in left hand  Muscle weakness (generalized)    Problem List There are no active problems to display for this patient.   Jason Campbell 01/29/2016, 12:56 PM  [facility] [address] [place], [place], [address] Phone: [phone]   Fax:  [phone]  Name: Jason Campbell [facility] MRN: [id number] Date of Birth: 07-24-1991

## 2016-01-29 NOTE — Patient Instructions (Signed)
Dr. Amanda PeaGramig,   Jason Campbell is receiving occupational therapy at our site. He is progressing towards goals for A/ROM and he is wearing splint when not exercising. His pain is grossly 5/10 with exercises.  A/ROM MP flexion digit 3,4: 40, 35 PIP flexion digits 3,4: 80, 55  When may we progress to P/ROM? When will he ready for strengthening? When can splint be d/c?  Please feel free to contact me with any questions.  Thanks, [employer] CorporationKathryn Rine, OTR/L

## 2016-01-31 ENCOUNTER — Encounter: Payer: Self-pay | Admitting: Occupational Therapy

## 2016-01-31 ENCOUNTER — Ambulatory Visit: Payer: Self-pay | Admitting: Occupational Therapy

## 2016-01-31 DIAGNOSIS — M25642 Stiffness of left hand, not elsewhere classified: Secondary | ICD-10-CM

## 2016-01-31 DIAGNOSIS — M79642 Pain in left hand: Secondary | ICD-10-CM

## 2016-01-31 NOTE — Therapy (Signed)
[facility] [address] [place], [place], [address] Phone: [phone]   Fax:  [phone]  Occupational Therapy Treatment  Patient Details  Name: Jason Campbell [facility] MRN: [id number] Date of Birth: September 06, 1991 Referring Provider: Dr. Amanda Pea  Encounter Date: 01/31/2016      OT End of Session - 01/31/16 0925    Visit Number 4   Number of Visits 25   Date for OT Re-Evaluation 04/24/16   Authorization Type self pay goals written for 12 weeks, anticipate d/c earlier depending on progress   OT Start Time 0845   OT Stop Time 0930   OT Time Calculation (min) 45 min   Activity Tolerance Patient tolerated treatment well      History reviewed. No pertinent past medical history.  Past Surgical History  Procedure Laterality Date  . Open reduction internal fixation (orif) metacarpal Left 01/12/2016    Procedure: OPEN REDUCTION INTERNAL FIXATION (ORIF) LEFT RING FINGER AND LEFT THIRD METACARPAL;  Surgeon: Dominica Severin, MD;  Location: [facility] OR;  Service: Orthopedics;  Laterality: Left;    There were no vitals filed for this visit.      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   Currently in Pain? Yes   Pain Score 3    Pain Location Hand   Pain Orientation Left   Pain Descriptors / Indicators Aching   Pain Type Acute pain   Pain Onset 1 to 4 weeks ago   Pain Frequency Intermittent   Aggravating Factors  movement   Pain Relieving Factors inactivity/meds                      OT Treatments/Exercises (OP) - 01/31/16 0001    Exercises   Exercises Hand   Hand Exercises   Other Hand Exercises Isolated A/ROM ex's in MP flex/ext, IP flex/ext, followed by composite flex/ext, and thumb opposition. Pt encouraged to go to end range both with flexion and extension   Modalities   Modalities Electrical Stimulation;Ultrasound   Electrical Stimulation   Electrical Stimulation Location volar forearm   Electrical Stimulation Action finger flexion   Electrical Stimulation Parameters 50 pps, 250 pw, 10 sec. on/off cycle x 10 minutes   Electrical Stimulation Goals --  ROM   Ultrasound   Ultrasound Location Palm of hand at base of long and ring finger   Ultrasound Parameters 3 Mhz, 50% pulsed, 0.8 wts/cm2, x 8 minutes   Ultrasound Goals Edema                  OT Short Term Goals - 01/23/16 1050    OT SHORT TERM GOAL #1   Title I with splint wear, care and precautions following 1-2 weeks of wear to ensure proper fit.(check 03/07/16)   Time 6   Period Weeks   Status New   OT SHORT TERM GOAL #2   Title I with inital HEP.   Time 6   Period Weeks   Status New   OT SHORT TERM GOAL #3   Title Pt will increase MP flexion for all digits to 70* for increased functional use.   Baseline 40, 35, 35, 40   Time 6   Period Weeks   Status New   OT SHORT TERM GOAL #4   Title Pt will increase PIP flexion for  all digits by 15* for increased functional use.   Baseline 60, 65, 50, 70   Time 6   Period Weeks   Status New   OT SHORT TERM GOAL #5   Title Pt will demonstrate MP extension and PIP extension for all digits at -5 or better.   Time 6   Period Weeks   Status New           OT Long Term Goals - 01/23/16 1057    OT LONG TERM GOAL #1   Title Pt will resume use of LUE as a non dominant assist at least 90% of the time for ADLs/IADLS. with pain less than or equal to 3/10.-check 04/24/16   Time 12   Period Weeks   Status New   OT LONG TERM GOAL #2   Title Pt will demonstrate LUE grip strength of at least 40 lbs. for increased functional use.   Time 12   Period Weeks   Status New   OT LONG TERM GOAL #3   Title Pt will demonstrate at least 95% composite finger flexion for LUE.   Time 12   Period Weeks   Status New   OT LONG TERM GOAL #4   Title Pt will  perform simulated work activities at a modified indpendent level.   Time 12   Period Weeks   Status New               Plan - 01/31/16 [id number]    Clinical Impression Statement Pt with observable less edema and increased A/ROM by end of session   OT Frequency 2x / week   OT Duration 12 weeks   OT Treatment/Interventions Self-care/ADL training;Therapeutic exercise;Patient/family education;Splinting;Neuromuscular education;Ultrasound;Cryotherapy;Parrafin;DME and/or AE instruction;Therapeutic activities;Therapeutic exercises;Electrical Stimulation;Scar mobilization;Moist Heat;Contrast Bath;Passive range of motion   Plan continue modalities and A/ROM. Begin P/ROM if cleared by MD, and ? flexion glove   Consulted and Agree with Plan of Care Patient;Family member/caregiver   Family Member Consulted girlfriend      Patient will benefit from skilled therapeutic intervention in order to improve the following deficits and impairments:  Decreased coordination, Decreased range of motion, Increased edema, Decreased endurance, Decreased activity tolerance, Pain, Impaired UE functional use, Decreased strength  Visit Diagnosis: Stiffness of left hand, not elsewhere classified  Pain in left hand    Problem List There are no active problems to display for this patient.   Kelli ChurnBallie, [place] Nunley Johnson, OTR/L 01/31/2016, 9:28 AM  [facility] [address] [place], [place], [address] Phone: [phone]   Fax:  [phone]  Name: Jason DibbleROBERT [facility] MRN: [id number] Date of Birth: 04/05/1991

## 2016-02-05 ENCOUNTER — Ambulatory Visit: Payer: Self-pay | Admitting: Occupational Therapy

## 2016-02-05 DIAGNOSIS — M25642 Stiffness of left hand, not elsewhere classified: Secondary | ICD-10-CM

## 2016-02-05 DIAGNOSIS — M79642 Pain in left hand: Secondary | ICD-10-CM

## 2016-02-05 DIAGNOSIS — M6281 Muscle weakness (generalized): Secondary | ICD-10-CM

## 2016-02-05 NOTE — Therapy (Signed)
[facility] [address] [place], [place], [address] Phone: [phone]   Fax:  [phone]  Occupational Therapy Treatment  Patient Details  Name: Jason Campbell MRN: [id number] Date of Birth: 10/30/1990 Referring Provider: Dr. Amanda PeaGramig  Encounter Date: 02/05/2016      OT End of Session - 02/05/16 1538    Visit Number 5   Number of Visits 25   Date for OT Re-Evaluation 04/24/16   Authorization Type self pay goals written for 12 weeks, anticipate d/c earlier depending on progress   OT Start Time 1151   OT Stop Time 1232   OT Time Calculation (min) 41 min   Activity Tolerance Patient tolerated treatment well   Behavior During Therapy [facility] for tasks assessed/performed      No past medical history on file.  Past Surgical History  Procedure Laterality Date  . Open reduction internal fixation (orif) metacarpal Left 01/12/2016    Procedure: OPEN REDUCTION INTERNAL FIXATION (ORIF) LEFT RING FINGER AND LEFT THIRD METACARPAL;  Surgeon: Dominica SeverinWilliam Gramig, MD;  Location: [facility] OR;  Service: Orthopedics;  Laterality: Left;    There were no vitals filed for this visit.      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   Currently in Pain? Yes   Pain Score 6    Pain Location Hand   Pain Orientation Left   Pain Descriptors / Indicators Aching   Pain Type Acute pain   Pain Onset More than a month ago   Pain Frequency Intermittent   Aggravating Factors  mo        Paraffin x 10 mins to LUE wrapped in coban in flexion for stiffness and pain. New orders received from MD to progress to P/ROM. Ultrasound 3MHz, 0.8 w/cm2 , 20% x 8 mins, no adverse reactions. A/ROM composite flexion, followed by blocking exercises for PIP flexion and DIP flexion for ring and small fingers Pt was instructed in  P/ROM HEP- see pt instructions. Ice pack at end of session x 8 mins for pain relief , no adverse                      OT Education - 02/05/16 1536    Education provided Yes   Education Details P/ROM HEP    Person(s) Educated Patient;Other (comment)  girlfriend   Methods Explanation;Demonstration;Verbal cues;Handout   Comprehension Verbalized understanding;Returned demonstration;Verbal cues required          OT Short Term Goals - 01/23/16 1050    OT SHORT TERM GOAL #1   Title I with splint wear, care and precautions following 1-2 weeks of wear to ensure proper fit.(check 03/07/16)   Time 6   Period Weeks   Status New   OT SHORT TERM GOAL #2   Title I with inital HEP.   Time 6   Period Weeks   Status New   OT SHORT TERM GOAL #3   Title Pt will increase MP flexion for all digits to 70* for increased functional use.   Baseline 40, 35, 35, 40   Time 6   Period Weeks   Status New   OT SHORT TERM GOAL #4   Title Pt will increase PIP flexion for all digits by 15* for increased functional use.   Baseline 60, 65, 50, 70  Time 6   Period Weeks   Status New   OT SHORT TERM GOAL #5   Title Pt will demonstrate MP extension and PIP extension for all digits at -5 or better.   Time 6   Period Weeks   Status New           OT Long Term Goals - 01/23/16 1057    OT LONG TERM GOAL #1   Title Pt will resume use of LUE as a non dominant assist at least 90% of the time for ADLs/IADLS. with pain less than or equal to 3/10.-check 04/24/16   Time 12   Period Weeks   Status New   OT LONG TERM GOAL #2   Title Pt will demonstrate LUE grip strength of at least 40 lbs. for increased functional use.   Time 12   Period Weeks   Status New   OT LONG TERM GOAL #3   Title Pt will demonstrate at least 95% composite finger flexion for LUE.   Time 12   Period Weeks   Status New   OT LONG TERM GOAL #4   Title Pt will perform simulated work activities at a modified indpendent  level.   Time 12   Period Weeks   Status New               Plan - 02/05/16 1537    Clinical Impression Statement Pt is progressing towards goals. He demonstrates understanding of HEP for P/ROM.   Rehab Potential Good   OT Frequency 2x / week   OT Duration 12 weeks   OT Treatment/Interventions Self-care/ADL training;Therapeutic exercise;Patient/family education;Splinting;Neuromuscular education;Ultrasound;Cryotherapy;Parrafin;DME and/or AE instruction;Therapeutic activities;Therapeutic exercises;Electrical Stimulation;Scar mobilization;Moist Heat;Contrast Bath;Passive range of motion   Plan modalities, reinforce P/ROM, consider flexion glove, cut down splint next week to free IP's   Consulted and Agree with Plan of Care Patient;Family member/caregiver   Family Member Consulted girlfriend      Patient will benefit from skilled therapeutic intervention in order to improve the following deficits and impairments:  Decreased coordination, Decreased range of motion, Increased edema, Decreased endurance, Decreased activity tolerance, Pain, Impaired UE functional use, Decreased strength  Visit Diagnosis: Stiffness of left hand, not elsewhere classified  Pain in left hand  Muscle weakness (generalized)    Problem List There are no active problems to display for this patient.   Jason Campbell 02/05/2016, 3:39 PM Keene Breath, OTR/L [phone] Phone: [phone] 3:39 PM 02/05/2016 [facility] [address] [place], [place], [address] Phone: [phone]   Fax:  [phone]  Name: Jason Campbell [facility] MRN: [id number] Date of Birth: Jan 19, 1991

## 2016-02-05 NOTE — Patient Instructions (Signed)
PROM: Finger MP Joints   Passively bend _each_______ finger of hand at big knuckle until stretch is felt. Hold _10___ seconds. Relax. Straighten finger as far as possible. Repeat __5__ times per set.  Do __4-6__ sessions per day.   PIP Flexion (Passive)   Use other hand to bend the middle joint of ___each___ finger down as far as possible. Hold _10___ seconds. Repeat __5__ times. Do _4-6___ sessions per day.    PROM: Finger DIP Joints   Passively bend __each______ finger(s) of  hand at tip joint until stretch is felt. Hold __10__ seconds. Relax. Straighten finger as far as possible. Repeat _5___ times per set.  Do __4-6__ sessions per day.  Copyright  VHI. All rights reserved.

## 2016-02-07 ENCOUNTER — Ambulatory Visit: Payer: Self-pay | Attending: Orthopedic Surgery | Admitting: Occupational Therapy

## 2016-02-07 ENCOUNTER — Encounter (HOSPITAL_COMMUNITY): Payer: Self-pay | Admitting: Orthopedic Surgery

## 2016-02-07 DIAGNOSIS — M25642 Stiffness of left hand, not elsewhere classified: Secondary | ICD-10-CM | POA: Insufficient documentation

## 2016-02-07 DIAGNOSIS — M6281 Muscle weakness (generalized): Secondary | ICD-10-CM | POA: Insufficient documentation

## 2016-02-07 DIAGNOSIS — M79642 Pain in left hand: Secondary | ICD-10-CM | POA: Insufficient documentation

## 2016-02-07 NOTE — Therapy (Signed)
[facility] [address] [place], [place], [address] Phone: [phone]   Fax:  [phone]  Occupational Therapy Treatment  Patient Details  Name: Jason [facility] MRN: [id number] Date of Birth: Feb 06, 1991 Referring Provider: Dr. Amanda Pea  Encounter Date: 02/07/2016      OT End of Session - 02/07/16 0854    Visit Number 6   Date for OT Re-Evaluation 04/24/16   Authorization Type self pay goals written for 12 weeks, anticipate d/c earlier depending on progress   OT Start Time 0805   OT Stop Time 0900   OT Time Calculation (min) 55 min   Activity Tolerance Patient tolerated treatment well   Behavior During Therapy [facility] for tasks assessed/performed      No past medical history on file.  Past Surgical History  Procedure Laterality Date  . Open reduction internal fixation (orif) metacarpal Left 01/12/2016    Procedure: OPEN REDUCTION INTERNAL FIXATION (ORIF) LEFT RING FINGER AND LEFT THIRD METACARPAL;  Surgeon: Dominica Severin, MD;  Location: [facility] OR;  Service: Orthopedics;  Laterality: Left;    There were no vitals filed for this visit.      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   Currently in Pain? Yes   Pain Score 5    Pain Location Hand   Pain Orientation Left   Pain Descriptors / Indicators Aching   Pain Type Acute pain   Pain Onset More than a month ago   Pain Frequency Intermittent   Aggravating Factors  movement   Pain Relieving Factors inactivity, meds        Treatment: Fluidotherapy x 10 mins while therapist initiated adjustment to splint to free IP's per PA instructions. A/ROM composite flexion MP flexion, PIP blocking, place and hold exercises for flexion and for ring finger extension Pt's splint fits well  following adjustment and allows A/ROM at IP's while in the splint NMES 50 pps, 250 pw, 10 secs cycle x 10 mins with pt performing finger flexion during on cycle.                         OT Short Term Goals - 01/23/16 1050    OT SHORT TERM GOAL #1   Title I with splint wear, care and precautions following 1-2 weeks of wear to ensure proper fit.(check 03/07/16)   Time 6   Period Weeks   Status New   OT SHORT TERM GOAL #2   Title I with inital HEP.   Time 6   Period Weeks   Status New   OT SHORT TERM GOAL #3   Title Pt will increase MP flexion for all digits to 70* for increased functional use.   Baseline 40, 35, 35, 40   Time 6   Period Weeks   Status New   OT SHORT TERM GOAL #4   Title Pt will increase PIP flexion for all digits by 15* for increased functional use.   Baseline 60, 65, 50, 70   Time 6   Period Weeks   Status New   OT SHORT TERM GOAL #5   Title Pt will demonstrate MP extension and PIP extension for all digits at -5 or better.   Time 6  Period Weeks   Status New           OT Long Term Goals - 01/23/16 1057    OT LONG TERM GOAL #1   Title Pt will resume use of LUE as a non dominant assist at least 90% of the time for ADLs/IADLS. with pain less than or equal to 3/10.-check 04/24/16   Time 12   Period Weeks   Status New   OT LONG TERM GOAL #2   Title Pt will demonstrate LUE grip strength of at least 40 lbs. for increased functional use.   Time 12   Period Weeks   Status New   OT LONG TERM GOAL #3   Title Pt will demonstrate at least 95% composite finger flexion for LUE.   Time 12   Period Weeks   Status New   OT LONG TERM GOAL #4   Title Pt will perform simulated work activities at a modified indpendent level.   Time 12   Period Weeks   Status New               Plan - 02/07/16 1044    Clinical Impression Statement Pt is progressing towards goals with improving A/ROM and P/ROM    Rehab Potential Good   OT Frequency 2x  / week   OT Duration 12 weeks   OT Treatment/Interventions Self-care/ADL training;Therapeutic exercise;Patient/family education;Splinting;Neuromuscular education;Ultrasound;Cryotherapy;Parrafin;DME and/or AE instruction;Therapeutic activities;Therapeutic exercises;Electrical Stimulation;Scar mobilization;Moist Heat;Contrast Bath;Passive range of motion   Plan modalities, consider flexion glove   Consulted and Agree with Plan of Care Patient;Family member/caregiver   Family Member Consulted girlfriend      Patient will benefit from skilled therapeutic intervention in order to improve the following deficits and impairments:  Decreased coordination, Decreased range of motion, Increased edema, Decreased endurance, Decreased activity tolerance, Pain, Impaired UE functional use, Decreased strength  Visit Diagnosis: Stiffness of left hand, not elsewhere classified  Pain in left hand  Muscle weakness (generalized)    Problem List There are no active problems to display for this patient.   RINE,KATHRYN 02/07/2016, 10:48 AM Keene BreathKathryn Rine, OTR/L [phone] Phone: [phone] 10:48 AM 02/07/2016 [facility] [address] [place], [place], [address] Phone: [phone]   Fax:  [phone]  Name: Jason Campbell MRN: [id number] Date of Birth: 03/02/1991

## 2016-02-14 ENCOUNTER — Encounter: Payer: Self-pay | Admitting: Occupational Therapy

## 2016-02-14 ENCOUNTER — Ambulatory Visit: Payer: Self-pay | Admitting: Occupational Therapy

## 2016-02-14 DIAGNOSIS — M6281 Muscle weakness (generalized): Secondary | ICD-10-CM

## 2016-02-14 DIAGNOSIS — M25642 Stiffness of left hand, not elsewhere classified: Secondary | ICD-10-CM

## 2016-02-14 NOTE — Patient Instructions (Signed)
  EXERCISES:   Reverse blocking: hold big knuckle bent, bend and STRAIGHTEN middle joint of middle and ring fingers. (focus is on straightening middle joint) Do 6x/day 15-20 reps  Wear Oval 8 finger splint during day, but take off to do exercises  1. Grip Strengthening (Resistive Putty)   Squeeze putty using thumb and all fingers. Repeat _15___ times. Do __2__ sessions per day.   2. IP Fisting (Resistive Putty)    Keeping big knuckles straight, bend fingertips to squeeze putty. Repeat __15__ times. Do __2__ sessions per day.   3. FINGERS: Extension (Putty)    Open hand and fingers to flatten putty. _15__ reps per set, _2__ sets per day. Do not hyperextend big knuckles or tip joints   Copyright  VHI. All rights reserved.        Copyright  VHI. All rights reserved.

## 2016-02-14 NOTE — Therapy (Signed)
[facility] [address] [place], [place], [address] Phone: [phone]   Fax:  [phone]  Occupational Therapy Treatment  Patient Details  Name: Jason Campbell [facility] MRN: [id number] Date of Birth: 1991/02/13 Referring Provider: Dr. Amanda Pea  Encounter Date: 02/14/2016      OT End of Session - 02/14/16 1441    Visit Number 7   Number of Visits 25   Date for OT Re-Evaluation 04/24/16   Authorization Type self pay goals written for 12 weeks, anticipate d/c earlier depending on progress   OT Start Time 1405   OT Stop Time 1430  pt had to leave 15 minutes early for a job interview   OT Time Calculation (min) 25 min   Activity Tolerance Patient tolerated treatment well      History reviewed. No pertinent past medical history.  Past Surgical History  Procedure Laterality Date  . Open reduction internal fixation (orif) metacarpal Left 01/12/2016    Procedure: OPEN REDUCTION INTERNAL FIXATION (ORIF) LEFT RING FINGER AND LEFT THIRD METACARPAL;  Surgeon: Dominica Severin, MD;  Location: [facility] OR;  Service: Orthopedics;  Laterality: Left;    There were no vitals filed for this visit.      Subjective Assessment - 02/14/16 1424    Subjective  Dr. 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   Currently in Pain? No/denies                      OT Treatments/Exercises (OP) - 02/14/16 0001    Hand Exercises   Other Hand Exercises Pt shown reverse blocking exercise with focus on PIP extension long and ring finger. Pt return demo   Other Hand Exercises Pt now almost 5 weeks post-op and protocol ok to begin light strengthening with putty. Pt issued putty HEP with red resistance putty - see pt instructions. Pt demo each x 10 reps. (Per pt, Dr. Amanda Pea reports fracture has healed)    Splinting   Splinting  Issued Oval 8 finger splint for PIP extension of ring finger d/t extensor lag present (approx. -40 to -45 degrees). Pt instructed to wear during day, but to take off for exercises to prevent stiffness. Pt agreed. (Pt is now only wearing protective splint at night per MD instructions)                OT Education - 02/14/16 1431    Education provided Yes   Education Details strengthening HEP, Oval 8 wear and care   Person(s) Educated Patient   Methods Explanation;Demonstration;Handout   Comprehension Verbalized understanding;Returned demonstration          OT Short Term Goals - 01/23/16 1050    OT SHORT TERM GOAL #1   Title I with splint wear, care and precautions following 1-2 weeks of wear to ensure proper fit.(check 03/07/16)   Time 6   Period Weeks   Status New   OT SHORT TERM GOAL #2   Title I with inital HEP.   Time 6   Period Weeks   Status New   OT SHORT TERM GOAL #3   Title Pt will increase MP flexion for all digits to 70* for increased functional use.   Baseline 40, 35, 35, 40   Time 6   Period Weeks   Status New   OT  SHORT TERM GOAL #4   Title Pt will increase PIP flexion for all digits by 15* for increased functional use.   Baseline 60, 65, 50, 70   Time 6   Period Weeks   Status New   OT SHORT TERM GOAL #5   Title Pt will demonstrate MP extension and PIP extension for all digits at -5 or better.   Time 6   Period Weeks   Status New           OT Long Term Goals - 01/23/16 1057    OT LONG TERM GOAL #1   Title Pt will resume use of LUE as a non dominant assist at least 90% of the time for ADLs/IADLS. with pain less than or equal to 3/10.-check 04/24/16   Time 12   Period Weeks   Status New   OT LONG TERM GOAL #2   Title Pt will demonstrate LUE grip strength of at least 40 lbs. for increased functional use.   Time 12   Period Weeks   Status New   OT LONG TERM GOAL #3   Title Pt will demonstrate at least 95% composite finger flexion for LUE.    Time 12   Period Weeks   Status New   OT LONG TERM GOAL #4   Title Pt will perform simulated work activities at a modified indpendent level.   Time 12   Period Weeks   Status New               Plan - 02/14/16 1442    Clinical Impression Statement Pt progressing per protocol. Pt with extensor lag at ring finger PIP joint.    OT Frequency 2x / week   OT Duration 12 weeks   OT Treatment/Interventions Self-care/ADL training;Therapeutic exercise;Patient/family education;Splinting;Neuromuscular education;Ultrasound;Cryotherapy;Parrafin;DME and/or AE instruction;Therapeutic activities;Therapeutic exercises;Electrical Stimulation;Scar mobilization;Moist Heat;Contrast Bath;Passive range of motion   Plan assess Oval 8 and take ROM measurments for extensor lag, begin checking STG's, review putty HEP    Consulted and Agree with Plan of Care Patient      Patient will benefit from skilled therapeutic intervention in order to improve the following deficits and impairments:  Decreased coordination, Decreased range of motion, Increased edema, Decreased endurance, Decreased activity tolerance, Pain, Impaired UE functional use, Decreased strength  Visit Diagnosis: Stiffness of left hand, not elsewhere classified  Muscle weakness (generalized)    Problem List There are no active problems to display for this patient.   Kelli ChurnBallie, [place] Digioia Johnson, OTR/L 02/14/2016, 2:44 PM  [facility] [address] [place], [place], [address] Phone: [phone]   Fax:  [phone]  Name: Jason Campbell [facility] MRN: [id number] Date of Birth: 11/05/1990

## 2016-02-19 ENCOUNTER — Ambulatory Visit: Payer: Self-pay | Admitting: Occupational Therapy

## 2016-02-19 DIAGNOSIS — M79642 Pain in left hand: Secondary | ICD-10-CM

## 2016-02-19 DIAGNOSIS — M25642 Stiffness of left hand, not elsewhere classified: Secondary | ICD-10-CM

## 2016-02-19 DIAGNOSIS — M6281 Muscle weakness (generalized): Secondary | ICD-10-CM

## 2016-02-19 NOTE — Therapy (Signed)
[facility] [address] [place], [place], [address] Phone: [phone]   Fax:  [phone]  Occupational Therapy Treatment  Patient Details  Name: Jason Campbell [facility] MRN: [id number] Date of Birth: 1990/10/22 Referring Provider: Dr. Amedeo Plenty  Encounter Date: 02/19/2016      OT End of Session - 02/19/16 1030    Visit Number 8   Number of Visits 25   Date for OT Re-Evaluation 04/24/16   Authorization Type self pay goals written for 12 weeks, anticipate d/c earlier depending on progress   OT Start Time 1025  pt late and in BR   OT Stop Time 1100   OT Time Calculation (min) 35 min   Activity Tolerance Patient tolerated treatment well   Behavior During Therapy [facility] for tasks assessed/performed      No past medical history on file.  Past Surgical History  Procedure Laterality Date  . Open reduction internal fixation (orif) metacarpal Left 01/12/2016    Procedure: OPEN REDUCTION INTERNAL FIXATION (ORIF) LEFT RING FINGER AND LEFT THIRD METACARPAL;  Surgeon: Roseanne Kaufman, MD;  Location: [facility];  Service: Orthopedics;  Laterality: Left;    There were no vitals filed for this visit.        Treatment: P/ROM composite flexion, PIP and DIP blocking for ring finger, Therapist checked progress towards short term goals. Gripper set a 25 lbs to pick up 1 inch blocks, for sustained grip, min difficulty Putty for increased sustained grip and pinch, reviewed HEP, pt returned demonstration Digi-flex red, 3 lbs for composite grip, then each individual finger, 5-10 reps each Digi-extend for 10 reps compositely then 5 reps for individual finger extension Therapist checked progress towards short term goals                      OT Short Term Goals - 02/19/16 1036    OT SHORT TERM GOAL #1   Title I with splint wear, care and precautions following 1-2 weeks of wear to ensure proper fit.(check 03/07/16)   Time 6    Period Weeks   Status Achieved   OT SHORT TERM GOAL #2   Title I with inital HEP.   Time 6   Period Weeks   Status Achieved   OT SHORT TERM GOAL #3   Title Pt will increase MP flexion for all digits to 70* for increased functional use.   Time 6   Period Weeks   Status Achieved  75,85, 75, 85   OT SHORT TERM GOAL #4   Title Pt will increase PIP flexion for all digits by 15* for increased functional use.   Time 6   Period Weeks   Status Achieved  95,95, 95, 90   OT SHORT TERM GOAL #5   Title Pt will demonstrate MP extension and PIP extension for all digits at -5 or better.   Time 6   Period Weeks   Status Partially Met  -20 at ring finger PIP, all other joints met           OT Long Term Goals - 01/23/16 1057    OT LONG TERM GOAL #1   Title Pt will resume use of LUE as a non dominant assist at least 90% of the time for ADLs/IADLS. with pain less than or equal to 3/10.-check 04/24/16   Time 12   Period Weeks   Status New   OT LONG TERM GOAL #2   Title Pt will demonstrate LUE  grip strength of at least 40 lbs. for increased functional use.   Time 12   Period Weeks   Status New   OT LONG TERM GOAL #3   Title Pt will demonstrate at least 95% composite finger flexion for LUE.   Time 12   Period Weeks   Status New   OT LONG TERM GOAL #4   Title Pt will perform simulated work activities at a modified indpendent level.   Time 12   Period Weeks   Status New               Plan - 02/19/16 1034    Clinical Impression Statement Pt is progressing towards goals with improved A/ROM and strength.   Rehab Potential Good   OT Frequency 2x / week   OT Duration 12 weeks   OT Treatment/Interventions Self-care/ADL training;Therapeutic exercise;Patient/family education;Splinting;Neuromuscular education;Ultrasound;Cryotherapy;Parrafin;DME and/or AE instruction;Therapeutic activities;Therapeutic exercises;Electrical Stimulation;Scar mobilization;Moist Heat;Contrast Bath;Passive  range of motion   Plan progress HEP   Consulted and Agree with Plan of Care Patient      Patient will benefit from skilled therapeutic intervention in order to improve the following deficits and impairments:  Decreased coordination, Decreased range of motion, Increased edema, Decreased endurance, Decreased activity tolerance, Pain, Impaired UE functional use, Decreased strength  Visit Diagnosis: Stiffness of left hand, not elsewhere classified  Muscle weakness (generalized)  Pain in left hand    Problem List There are no active problems to display for this patient.   RINE,KATHRYN 02/19/2016, 10:42 AM Theone Murdoch, OTR/L [phone] Phone: [phone] 10:43 AM 02/19/2016 [facility] [address] [place], [place], [address] Phone: [phone]   Fax:  [phone]  Name: Jason Campbell [facility] MRN: [id number] Date of Birth: Sep 28, 1990

## 2016-02-21 ENCOUNTER — Ambulatory Visit: Payer: Self-pay | Admitting: Occupational Therapy

## 2016-02-21 DIAGNOSIS — M25642 Stiffness of left hand, not elsewhere classified: Secondary | ICD-10-CM

## 2016-02-21 DIAGNOSIS — M6281 Muscle weakness (generalized): Secondary | ICD-10-CM

## 2016-02-21 DIAGNOSIS — M79642 Pain in left hand: Secondary | ICD-10-CM

## 2016-02-21 NOTE — Therapy (Signed)
[facility] [address] [place], [place], [address] Phone: [phone]   Fax:  [phone]  Occupational Therapy Treatment  Patient Details  Name: Jason Campbell [facility] MRN: [id number] Date of Birth: 1990/09/15 Referring Provider: Dr. Amedeo Plenty  Encounter Date: 02/21/2016      OT End of Session - 02/21/16 1044    Visit Number 9   Number of Visits 25   Date for OT Re-Evaluation 04/24/16   Authorization Type self pay goals written for 12 weeks, anticipate d/c earlier depending on progress   OT Start Time 1020   OT Stop Time 1100   OT Time Calculation (min) 40 min      No past medical history on file.  Past Surgical History  Procedure Laterality Date  . Open reduction internal fixation (orif) metacarpal Left 01/12/2016    Procedure: OPEN REDUCTION INTERNAL FIXATION (ORIF) LEFT RING FINGER AND LEFT THIRD METACARPAL;  Surgeon: Roseanne Kaufman, MD;  Location: [facility];  Service: Orthopedics;  Laterality: Left;    There were no vitals filed for this visit.      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   Currently in Pain? Yes   Pain Score 2    Pain Location Hand   Pain Orientation Left   Pain Descriptors / Indicators Aching   Pain Type Acute pain   Pain Onset More than a month ago   Pain Frequency Intermittent   Aggravating Factors  use   Pain Relieving Factors inactivity, heat            Treatment:Fluidotherapy prior to ROM,10 mins no adverse reactions  A/ROM composite flexion, then PIP blocking for ring finger, Gripper set a 25 lbs to pick up 1 inch blocks, for sustained grip, min difficulty initially, this was too easy so resistance was increased to 35 lbs for 1/2 the blocks then resistance was increased to 55 lbs for the remainder, min difficulty/ drops. Putty for composite finger extension  x 10 reps Digi-flex green 5 lbs for composite grip, then each individual finger, 10 reps each Digi-extend light red  for 10 reps compositely  Graded clothespins with ring and middle finger for increased sustained pinch, min difficulty                 OT Short Term Goals - 02/19/16 1036    OT SHORT TERM GOAL #1   Title I with splint wear, care and precautions following 1-2 weeks of wear to ensure proper fit.(check 03/07/16)   Time 6   Period Weeks   Status Achieved   OT SHORT TERM GOAL #2   Title I with inital HEP.   Time 6   Period Weeks   Status Achieved   OT SHORT TERM GOAL #3   Title Pt will increase MP flexion for all digits to 70* for increased functional use.   Time 6   Period Weeks   Status Achieved  75,85, 75, 85   OT SHORT TERM GOAL #4   Title Pt will increase PIP flexion for all digits by 15* for increased functional use.   Time 6   Period Weeks   Status Achieved  95,95, 95, 90   OT SHORT TERM GOAL #5   Title Pt will demonstrate MP extension and PIP extension for all digits at -5 or better.   Time 6  Period Weeks   Status Partially Met  -20 at ring finger PIP, all other joints met           OT Long Term Goals - 01/23/16 1057    OT LONG TERM GOAL #1   Title Pt will resume use of LUE as a non dominant assist at least 90% of the time for ADLs/IADLS. with pain less than or equal to 3/10.-check 04/24/16   Time 12   Period Weeks   Status New   OT LONG TERM GOAL #2   Title Pt will demonstrate LUE grip strength of at least 40 lbs. for increased functional use.   Time 12   Period Weeks   Status New   OT LONG TERM GOAL #3   Title Pt will demonstrate at least 95% composite finger flexion for LUE.   Time 12   Period Weeks   Status New   OT LONG TERM GOAL #4   Title Pt will perform simulated work activities at a modified indpendent level.   Time 12   Period Weeks   Status New               Plan - 02/21/16 1041    Clinical Impression  Statement Pt is progressing towards goals with improving ROM and strength. Pt's PIP extension lag i s improving.    OT Frequency 2x / week   OT Duration 12 weeks   OT Treatment/Interventions Self-care/ADL training;Therapeutic exercise;Patient/family education;Splinting;Neuromuscular education;Ultrasound;Cryotherapy;Parrafin;DME and/or AE instruction;Therapeutic activities;Therapeutic exercises;Electrical Stimulation;Scar mobilization;Moist Heat;Contrast Bath;Passive range of motion   Plan continue ROM, strength   Consulted and Agree with Plan of Care Patient      Patient will benefit from skilled therapeutic intervention in order to improve the following deficits and impairments:  Decreased coordination, Decreased range of motion, Increased edema, Decreased endurance, Decreased activity tolerance, Pain, Impaired UE functional use, Decreased strength  Visit Diagnosis: Stiffness of left hand, not elsewhere classified  Muscle weakness (generalized)  Pain in left hand    Problem List There are no active problems to display for this patient.   RINE,KATHRYN 02/21/2016, 10:47 AM Theone Murdoch, OTR/L [phone] Phone: [phone] 10:47 AM 02/21/2016 [facility] [address] [place], [place], [address] Phone: [phone]   Fax:  [phone]  Name: Jason Campbell [facility] MRN: [id number] Date of Birth: 01/15/1991

## 2016-02-26 ENCOUNTER — Encounter: Payer: Self-pay | Admitting: Occupational Therapy

## 2016-02-26 ENCOUNTER — Ambulatory Visit: Payer: Self-pay | Admitting: Occupational Therapy

## 2016-02-26 DIAGNOSIS — M25642 Stiffness of left hand, not elsewhere classified: Secondary | ICD-10-CM

## 2016-02-26 DIAGNOSIS — M79642 Pain in left hand: Secondary | ICD-10-CM

## 2016-02-26 DIAGNOSIS — M6281 Muscle weakness (generalized): Secondary | ICD-10-CM

## 2016-02-26 NOTE — Therapy (Signed)
[facility] [address] [place], [place], [address] Phone: [phone]   Fax:  [phone]  Occupational Therapy Treatment  Patient Details  Name: Jason Campbell MRN: [id number] Date of Birth: 09/17/1990 Referring Provider: Dr. Amedeo Plenty  Encounter Date: 02/26/2016      OT End of Session - 02/26/16 1030    Visit Number 10   Number of Visits 25   Date for OT Re-Evaluation 04/24/16   Authorization Type self pay goals written for 12 weeks, anticipate d/c earlier depending on progress   OT Start Time 0930   OT Stop Time 1015   OT Time Calculation (min) 45 min   Equipment Utilized During Treatment fluiodotherapy   Activity Tolerance Patient tolerated treatment well      History reviewed. No pertinent past medical history.  Past Surgical History  Procedure Laterality Date  . Open reduction internal fixation (orif) metacarpal Left 01/12/2016    Procedure: OPEN REDUCTION INTERNAL FIXATION (ORIF) LEFT RING FINGER AND LEFT THIRD METACARPAL;  Surgeon: Roseanne Kaufman, MD;  Location: [facility];  Service: Orthopedics;  Laterality: Left;    There were no vitals filed for this visit.      Subjective Assessment - 02/26/16 0949    Subjective  Can I use a gripper now? 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   Currently in Pain? Yes   Pain Score 2    Pain Location Hand   Pain Orientation Left   Pain Descriptors / Indicators Aching   Pain Type Acute pain   Pain Onset More than a month ago   Pain Frequency Intermittent   Aggravating Factors  use   Pain Relieving Factors rest, heat            [facility] OT Assessment - 02/26/16 0001    Hand Function   Right Hand Grip (lbs) 102 lbs   Left Hand Grip (lbs) 50 lbs                  OT  Treatments/Exercises (OP) - 02/26/16 0001    ADLs   ADL Comments Grip strength assessed - updated LTG #2. Pt also instructed ok to use vice gripper at home b/t 35-50 lbs at this time   Hand Exercises   Other Hand Exercises Gripper set at 55 lbs resistance to pick up blocks for sustained strength Lt hand with no difficulty or drops. Increased to 75 lbs resistance to pick up remainder of blocks (about 1/2) with only min difficulty   Other Hand Exercises Emphasized importance of blocking exercises and reverse blocking ex's especially for ring finger d/t end range tightness in PIP flexion, and PIP extensor lag. Pt performed each x 10 reps.    Modalities   Modalities Fluidotherapy   LUE Fluidotherapy   Number Minutes Fluidotherapy 10 Minutes   LUE Fluidotherapy Location Hand;Wrist   Comments at beginning of session to decrease stiffness/pain   Manual Therapy   Manual therapy comments P/ROM to ring finger MP flexion, PIP flexion and extension                  OT Short Term Goals - 02/19/16 1036    OT SHORT TERM GOAL #1   Title I with splint wear, care and precautions following 1-2 weeks of  wear to ensure proper fit.(check 03/07/16)   Time 6   Period Weeks   Status Achieved   OT SHORT TERM GOAL #2   Title I with inital HEP.   Time 6   Period Weeks   Status Achieved   OT SHORT TERM GOAL #3   Title Pt will increase MP flexion for all digits to 70* for increased functional use.   Time 6   Period Weeks   Status Achieved  75,85, 75, 85   OT SHORT TERM GOAL #4   Title Pt will increase PIP flexion for all digits by 15* for increased functional use.   Time 6   Period Weeks   Status Achieved  95,95, 95, 90   OT SHORT TERM GOAL #5   Title Pt will demonstrate MP extension and PIP extension for all digits at -5 or better.   Time 6   Period Weeks   Status Partially Met  -20 at ring finger PIP, all other joints met           OT Long Term Goals - 02/26/16 1030    OT LONG TERM  GOAL #1   Title Pt will resume use of LUE as a non dominant assist at least 90% of the time for ADLs/IADLS. with pain less than or equal to 3/10.-check 04/24/16   Time 12   Period Weeks   Status On-going   OT LONG TERM GOAL #2   Title Pt will demonstrate LUE grip strength of at least 40 lbs. for increased functional use - met. Upgraded to 60 lbs   Time 12   Period Weeks   Status Revised  revised/upgraded on 02/26/16 - pt now 50 lbs   OT LONG TERM GOAL #3   Title Pt will demonstrate at least 95% composite finger flexion for LUE.   Time 12   Period Weeks   Status Achieved   OT LONG TERM GOAL #4   Title Pt will perform simulated work activities at a modified indpendent level.   Time 12   Period Weeks   Status On-going               Plan - 02/26/16 1032    Clinical Impression Statement Pt making significant progress in grip strength and upgraded LTG #2. Pt met LTG #3. Pt with only end range tightness in ring finger PIP flexion, and PIP extensor lag (however improving).    Rehab Potential Good   OT Frequency 2x / week   OT Duration 12 weeks   OT Treatment/Interventions Self-care/ADL training;Therapeutic exercise;Patient/family education;Splinting;Neuromuscular education;Ultrasound;Cryotherapy;Parrafin;DME and/or AE instruction;Therapeutic activities;Therapeutic exercises;Electrical Stimulation;Scar mobilization;Moist Heat;Contrast Bath;Passive range of motion   Plan continue grip strengthening/pinch strengthening, work on PIP flexion and extension   OT Home Exercise Plan issued : A/ROM, P/ROM, place and holds, red putty   Consulted and Agree with Plan of Care Patient      Patient will benefit from skilled therapeutic intervention in order to improve the following deficits and impairments:  Decreased coordination, Decreased range of motion, Increased edema, Decreased endurance, Decreased activity tolerance, Pain, Impaired UE functional use, Decreased strength  Visit  Diagnosis: Stiffness of left hand, not elsewhere classified  Muscle weakness (generalized)  Pain in left hand    Problem List There are no active problems to display for this patient.   Carey Bullocks, OTR/L 02/26/2016, 10:35 AM  [facility] [address] [place], [place], [address] Phone: [phone]   Fax:  [place]  Name: Jason Campbell MRN: [id number] Date of Birth: 23-Jun-1991

## 2016-02-28 ENCOUNTER — Encounter: Payer: Self-pay | Admitting: Occupational Therapy

## 2016-02-28 ENCOUNTER — Ambulatory Visit: Payer: Self-pay | Admitting: Occupational Therapy

## 2016-02-28 DIAGNOSIS — M25642 Stiffness of left hand, not elsewhere classified: Secondary | ICD-10-CM

## 2016-02-28 DIAGNOSIS — M6281 Muscle weakness (generalized): Secondary | ICD-10-CM

## 2016-02-28 NOTE — Therapy (Signed)
[facility] [address] [place], [place], [address] Phone: [phone]   Fax:  [phone]  Occupational Therapy Treatment  Patient Details  Name: Jason Campbell [facility] MRN: [id number] Date of Birth: 1991/07/20 Referring Provider: Dr. Amedeo Plenty  Encounter Date: 02/28/2016      OT End of Session - 02/28/16 1527    Visit Number 11   Number of Visits 25   Date for OT Re-Evaluation 04/24/16   Authorization Type self pay goals written for 12 weeks, anticipate d/c earlier depending on progress   OT Start Time 1450   OT Stop Time 1530   OT Time Calculation (min) 40 min   Equipment Utilized During Treatment fluiodotherapy   Activity Tolerance Patient tolerated treatment well   Behavior During Therapy [facility] for tasks assessed/performed      History reviewed. No pertinent past medical history.  Past Surgical History  Procedure Laterality Date  . Open reduction internal fixation (orif) metacarpal Left 01/12/2016    Procedure: OPEN REDUCTION INTERNAL FIXATION (ORIF) LEFT RING FINGER AND LEFT THIRD METACARPAL;  Surgeon: Roseanne Kaufman, MD;  Location: [facility];  Service: Orthopedics;  Laterality: Left;    There were no vitals filed for this visit.      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   Currently in Pain? No/denies                      OT Treatments/Exercises (OP) - 02/28/16 0001    Hand Exercises   Other Hand Exercises Gripper set at 75 lbs. resistance to pick up blocks for sustained grip strength Lt hand with min difficulty only. No rest breaks required. Pt then placing and removing clothespins (blue and black resistance) pinching b/t thumb, long, and ring fingers.    Other Hand Exercises Upgraded putty resistance to blue and issued blue putty for home use - pt performed  putty ex's (mass grasp and IP  fisting) x 10 reps each. Pt shown reverse blocking ex for ring PIP extension with mild resistance using rubber band.  Pt also shown pen rolling ex's for IP flexion and composite MP/IP flexion. Pt return demo with increased motion   LUE Fluidotherapy   Number Minutes Fluidotherapy 12 Minutes   LUE Fluidotherapy Location Hand;Wrist   Comments At beginning of session to decrease stiffness   Manual Therapy   Manual therapy comments P/ROM to ring finger MP flexion, PIP flexion and extension                  OT Short Term Goals - 02/19/16 1036    OT SHORT TERM GOAL #1   Title I with splint wear, care and precautions following 1-2 weeks of wear to ensure proper fit.(check 03/07/16)   Time 6   Period Weeks   Status Achieved   OT SHORT TERM GOAL #2   Title I with inital HEP.   Time 6   Period Weeks   Status Achieved   OT SHORT TERM GOAL #3   Title Pt will increase MP flexion for all digits to 70* for increased functional use.   Time 6   Period Weeks   Status Achieved  75,85, 75, 85   OT SHORT TERM GOAL #4   Title Pt will increase PIP flexion for all digits by 15* for increased functional use.   Time  6   Period Weeks   Status Achieved  95,95, 95, 90   OT SHORT TERM GOAL #5   Title Pt will demonstrate MP extension and PIP extension for all digits at -5 or better.   Time 6   Period Weeks   Status Partially Met  -20 at ring finger PIP, all other joints met           OT Long Term Goals - 02/28/16 1456    OT LONG TERM GOAL #1   Title Pt will resume use of LUE as a non dominant assist at least 90% of the time for ADLs/IADLS. with pain less than or equal to 3/10.-check 04/24/16   Time 12   Period Weeks   Status On-going   OT LONG TERM GOAL #2   Title Pt will demonstrate LUE grip strength of at least 40 lbs. for increased functional use - MET.  Upgraded to 60 lbs   Time 12   Period Weeks   Status Revised  revised/upgraded on 02/26/16 - pt now 50 lbs   OT LONG TERM GOAL  #3   Title Pt will demonstrate at least 95% composite finger flexion for LUE.   Time 12   Period Weeks   Status Achieved   OT LONG TERM GOAL #4   Title Pt will perform simulated work activities at a modified indpendent level.   Time 12   Period Weeks   Status On-going               Plan - 02/28/16 1637    Clinical Impression Statement Pt continues to make progress with ROM and strength and progressing towards remaining LTG's.    Rehab Potential Good   OT Frequency 2x / week   OT Duration 12 weeks   OT Treatment/Interventions Self-care/ADL training;Therapeutic exercise;Patient/family education;Splinting;Neuromuscular education;Ultrasound;Cryotherapy;Parrafin;DME and/or AE instruction;Therapeutic activities;Therapeutic exercises;Electrical Stimulation;Scar mobilization;Moist Heat;Contrast Bath;Passive range of motion   Plan Continue fluidotherapy, review updated ex's from 02/28/16 session, gripper activity with silver coil. Anticipate d/c within 1-2 weeks   OT Home Exercise Plan issued : A/ROM, P/ROM, place and holds, red putty   Consulted and Agree with Plan of Care Patient      Patient will benefit from skilled therapeutic intervention in order to improve the following deficits and impairments:  Decreased coordination, Decreased range of motion, Increased edema, Decreased endurance, Decreased activity tolerance, Pain, Impaired UE functional use, Decreased strength  Visit Diagnosis: Stiffness of left hand, not elsewhere classified  Muscle weakness (generalized)    Problem List There are no active problems to display for this patient.   Carey Bullocks, OTR/L 02/28/2016, 4:39 PM  [facility] [address] [place], [place], [address] Phone: [phone]   Fax:  [phone]  Name: Jason Campbell [facility] MRN: [id number] Date of Birth: Jul 13, 1991

## 2016-03-04 ENCOUNTER — Ambulatory Visit: Payer: Self-pay | Admitting: Occupational Therapy

## 2016-03-07 ENCOUNTER — Ambulatory Visit: Payer: Self-pay | Admitting: Occupational Therapy

## 2016-03-07 DIAGNOSIS — M79642 Pain in left hand: Secondary | ICD-10-CM

## 2016-03-07 DIAGNOSIS — M25642 Stiffness of left hand, not elsewhere classified: Secondary | ICD-10-CM

## 2016-03-07 DIAGNOSIS — M6281 Muscle weakness (generalized): Secondary | ICD-10-CM

## 2016-03-07 NOTE — Therapy (Signed)
[facility] [address] [place], [place], [address] Phone: [phone]   Fax:  [phone]  Occupational Therapy Treatment  Patient Details  Name: Jason Campbell [facility] MRN: [id number] Date of Birth: 08/03/1991 Referring Provider: Dr. Amedeo Plenty  Encounter Date: 03/07/2016    No past medical history on file.  Past Surgical History  Procedure Laterality Date  . Open reduction internal fixation (orif) metacarpal Left 01/12/2016    Procedure: OPEN REDUCTION INTERNAL FIXATION (ORIF) LEFT RING FINGER AND LEFT THIRD METACARPAL;  Surgeon: Roseanne Kaufman, MD;  Location: [facility];  Service: Orthopedics;  Laterality: Left;    There were no vitals filed for this visit.      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   Currently in Pain? No/denies         Other Hand Exercises  Gripper set at 75 lbs. resistance to pick up blocks for sustained grip strength Lt hand with no significant difficulty only. No rest breaks required.  Lifting 25 lbs box 15x with bilateral UE's for work simulation without difficulty Other Hand Exercises   pt performed  putty ex's with blue putty(mass grasp and IP fisting) x 10 reps each. Pt performed  reverse blocking ex for ring PIP extension with mild resistance using rubber band.  Pt performed pen rolling ex's for IP flexion and composite MP/IP flexion. Pt returned demonstration Therapist checked progress towards goals. Pt agrees with plans for d/c                            OT Short Term Goals - 03/07/16 1040    OT SHORT TERM GOAL #1   Title I with splint wear, care and precautions following 1-2 weeks of wear to ensure proper fit.(check 03/07/16)   Time 6   Period Weeks   Status Achieved   OT SHORT TERM GOAL  #2   Title I with inital HEP.   Time 6   Period Weeks   Status Achieved   OT SHORT TERM GOAL #3   Title Pt will increase MP flexion for all digits to 70* for increased functional use.   Time 6   Period Weeks   Status Achieved  75,85, 75, 85   OT SHORT TERM GOAL #4   Title Pt will increase PIP flexion for all digits by 15* for increased functional use.   Time 6   Period Weeks   Status Achieved  95,95, 95, 90   OT SHORT TERM GOAL #5   Title Pt will demonstrate MP extension and PIP extension for all digits at -5 or better.   Time 6   Period Weeks   Status Partially Met  -15 at ring finger PIP, all other joints met           OT Long Term Goals - 03/07/16 1022    OT LONG TERM GOAL #1   Title Pt will resume use of LUE as a non dominant assist at least 90% of the time for ADLs/IADLS. with pain less than or equal to 3/10.-check 04/24/16   Time 12   Period Weeks   Status Achieved   OT LONG TERM GOAL #2   Title Pt will demonstrate LUE grip strength of at least  40 lbs. for increased functional use - MET.  Upgraded to 60 lbs   Time 12   Period Weeks   Status Achieved  met 60 lbs   OT LONG TERM GOAL #3   Title Pt will demonstrate at least 95% composite finger flexion for LUE.   Time 12   Period Weeks   Status Achieved   OT LONG TERM GOAL #4   Title Pt will perform simulated work activities at a modified inedpendent level.   Time 12   Period Weeks   Status Achieved               Plan - 03/07/16 1030    Clinical Impression Statement Pt demonstrates excellent overall progress.towards long term goals. Pt agrees with plans for discharge.   Rehab Potential Good   OT Frequency 2x / week   OT Duration 12 weeks   OT Treatment/Interventions Self-care/ADL training;Therapeutic exercise;Patient/family education;Splinting;Neuromuscular education;Ultrasound;Cryotherapy;Parrafin;DME and/or AE instruction;Therapeutic activities;Therapeutic exercises;Electrical Stimulation;Scar  mobilization;Moist Heat;Contrast Bath;Passive range of motion   Plan discharge OT   OT Home Exercise Plan issued : A/ROM, P/ROM, place and holds, red putty   Consulted and Agree with Plan of Care Patient   Family Member Consulted girlfriend      Patient will benefit from skilled therapeutic intervention in order to improve the following deficits and impairments:  Decreased coordination, Decreased range of motion, Increased edema, Decreased endurance, Decreased activity tolerance, Pain, Impaired UE functional use, Decreased strength  Visit Diagnosis: Stiffness of left hand, not elsewhere classified  Muscle weakness (generalized)  Pain in left hand   OCCUPATIONAL THERAPY DISCHARGE SUMMARY    Current functional level related to goals / functional outcomes: See above, pt made excellent overall progress.   Remaining deficits: Mildly decreased strength and ROM   Education / Equipment: Pt was educated regarding HEP, and he demonstrates understanding.  Plan: Patient agrees to discharge.  Patient goals were not met. Patient is being discharged due to being pleased with the current functional level.  ?????      Problem List There are no active problems to display for this patient.   Rube Sanchez 03/07/2016, 10:43 AM Theone Murdoch, OTR/L [phone] Phone: [phone] 1:45 PM 06/30/2017[facility] [address] [place], [place], [address] Phone: [phone]   Fax:  [phone]  Name: Jason Campbell [facility] MRN: [id number] Date of Birth: [birth year]

## 2016-03-10 ENCOUNTER — Ambulatory Visit: Payer: Self-pay | Admitting: Occupational Therapy

## 2016-03-13 ENCOUNTER — Ambulatory Visit: Payer: Self-pay | Admitting: Occupational Therapy

## 2016-12-09 IMAGING — CR DG WRIST COMPLETE 3+V*L*
3 series · 3 of 3 positions shown · non-contrast
Comparison: None.

CLINICAL DATA: The patient dropped heavy weight onto the left hand
this morning. Pain. Initial encounter.

EXAM:
LEFT WRIST - COMPLETE 3+ VIEW

[x wrist obl left]
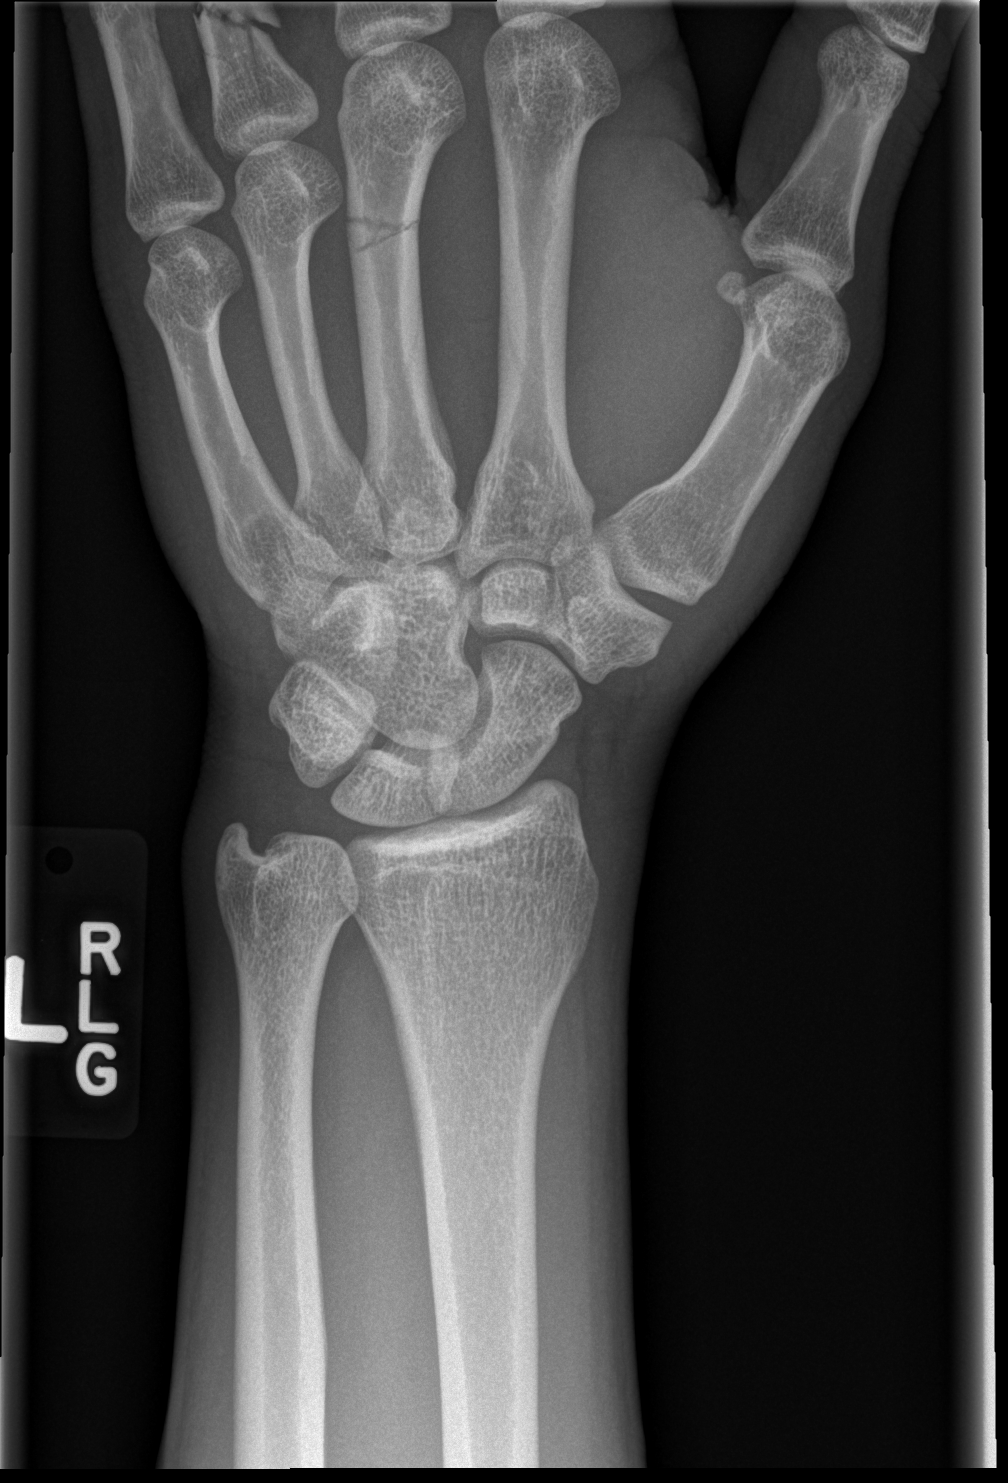

[x wrist pa left]
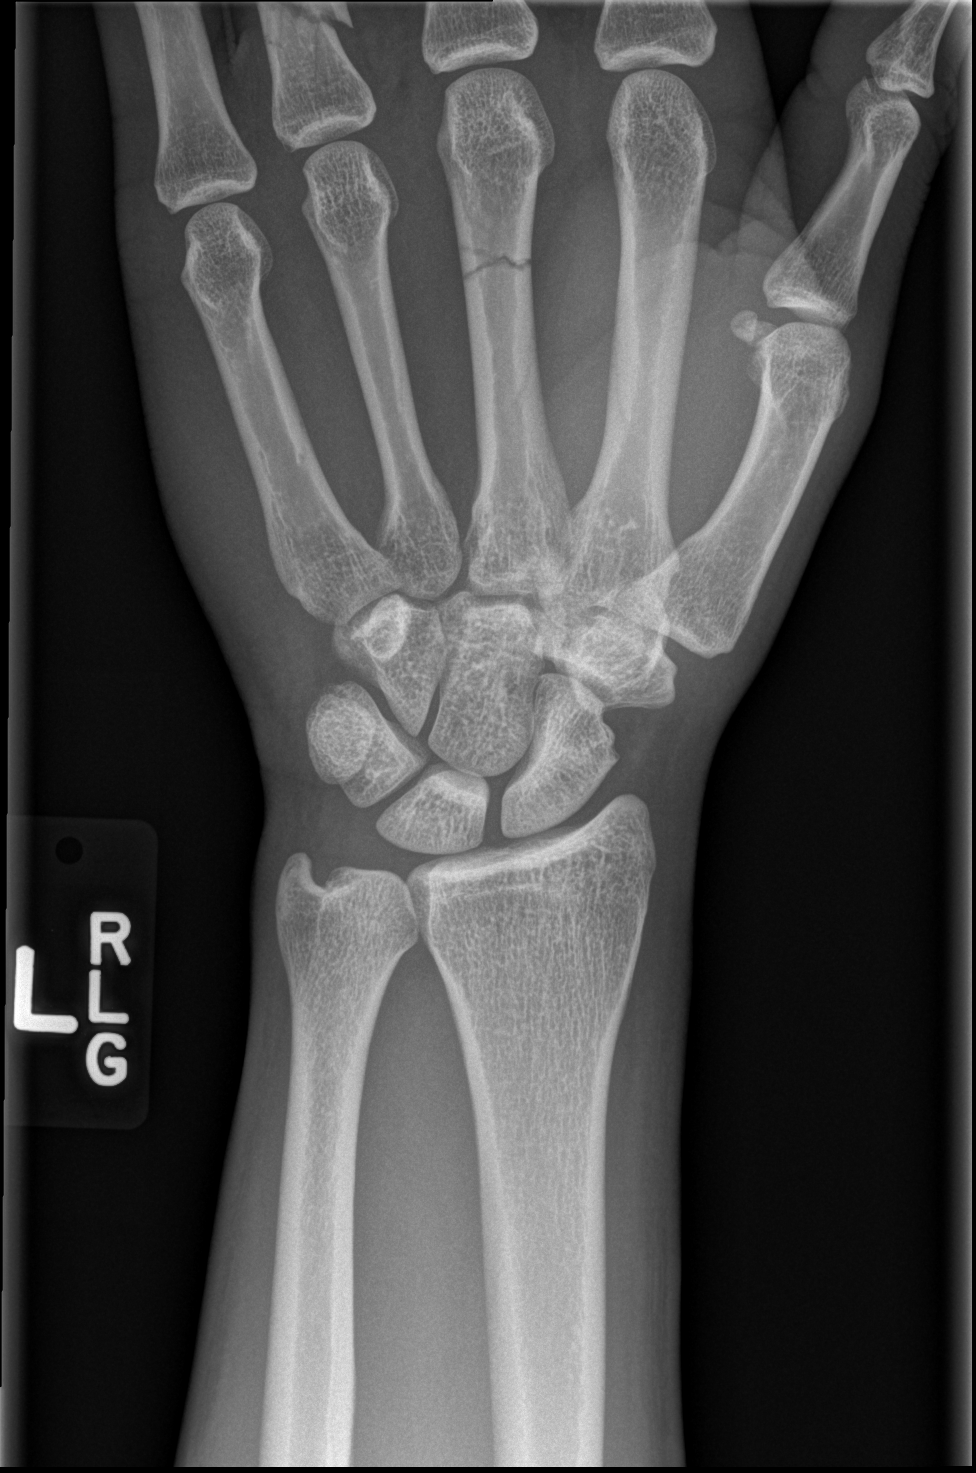

[x wrist lat left]
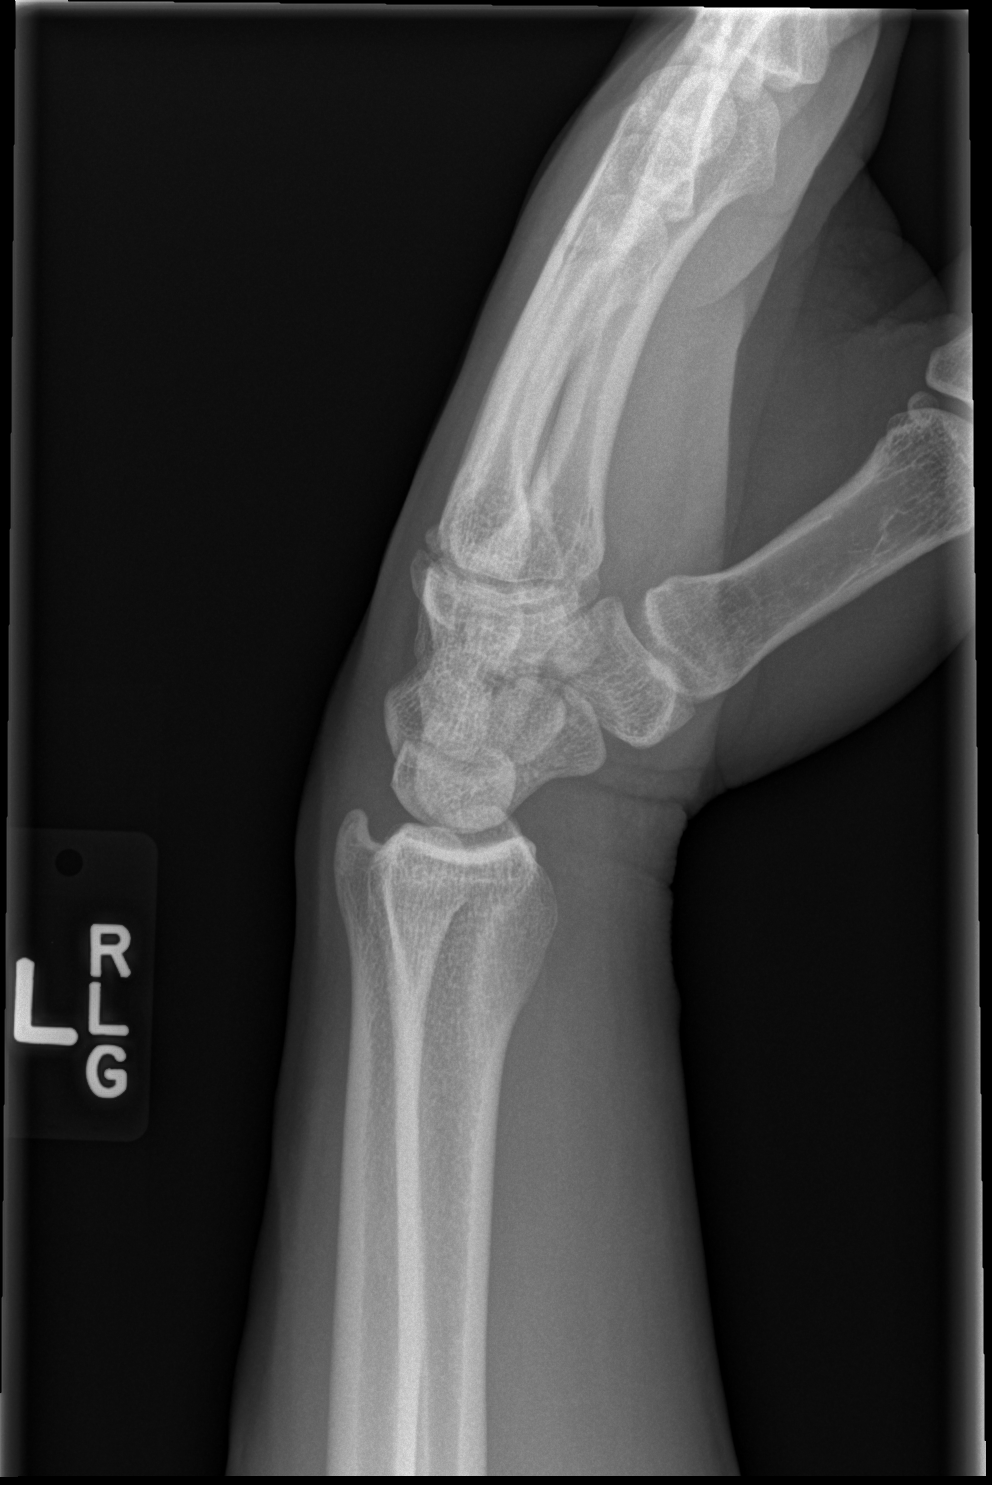

[3 of 3 positions shown; findings below may reference images not displayed]

FINDINGS: Fractures of the third metacarpal and proximal phalanx of the ring
finger are seen as on dedicated plain films of the hand. No other
acute bony or joint abnormality is identified. Soft tissues are
unremarkable.
IMPRESSION: Fractures of the proximal phalanx of the ring finger and third
metacarpal as seen on plain films of the hand. No other acute
abnormality.

## 2016-12-09 IMAGING — CR DG HAND COMPLETE 3+V*L*
3 series · 3 of 3 positions shown · non-contrast
Comparison: None.

CLINICAL DATA: The patient dropped a heavy weight on to his left
hand this morning with onset of pain. Initial encounter.

EXAM:
LEFT HAND - COMPLETE 3+ VIEW

[x hand pa left]
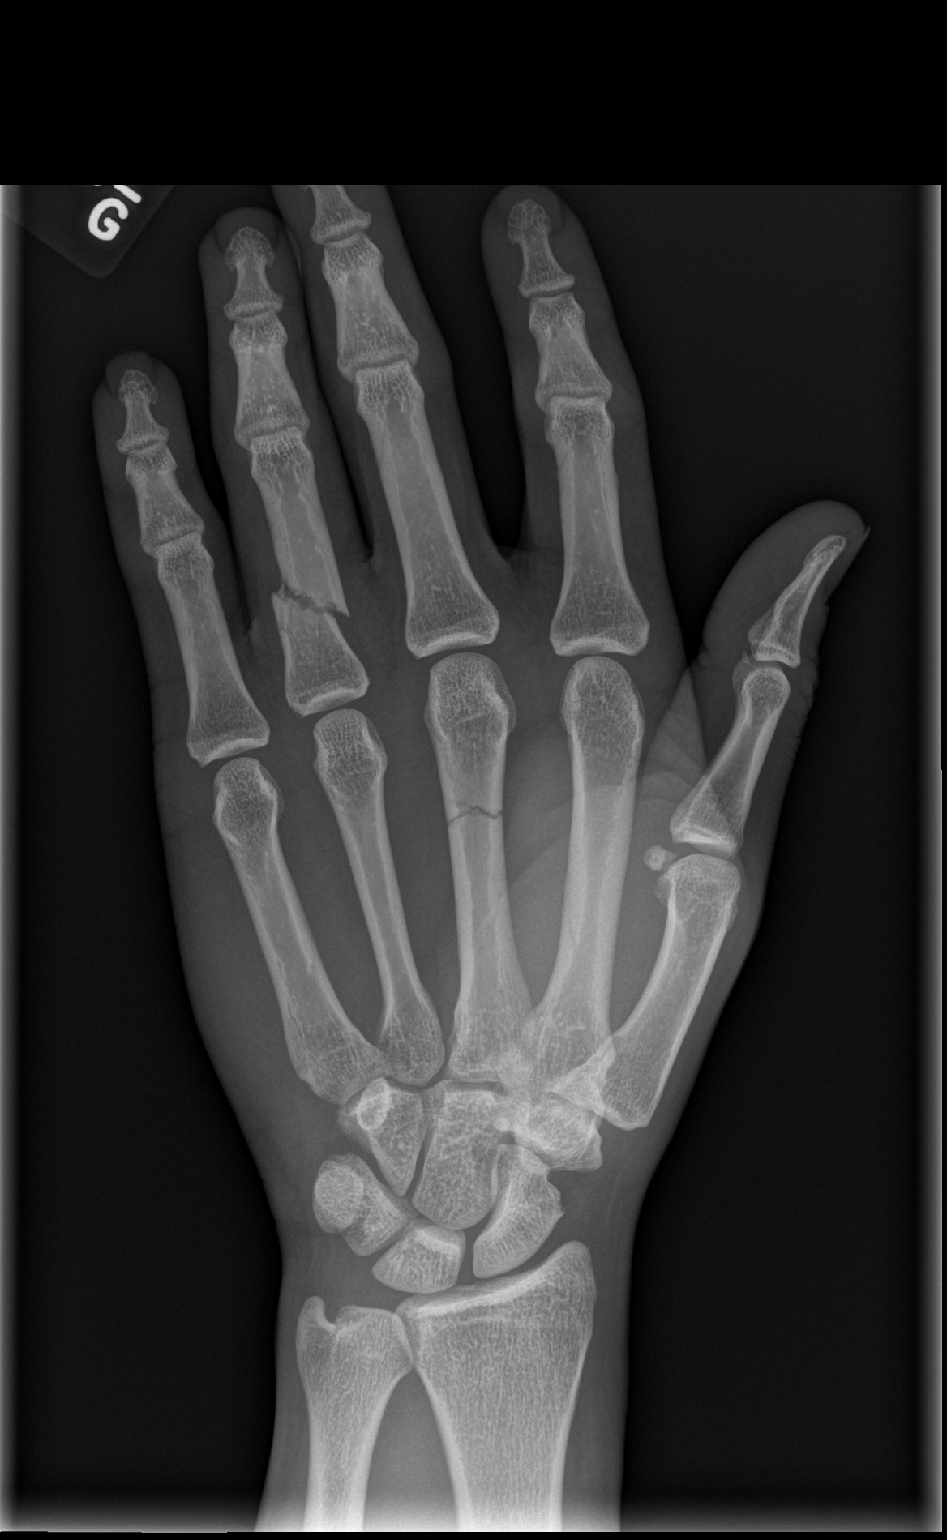

[x hand obl left]
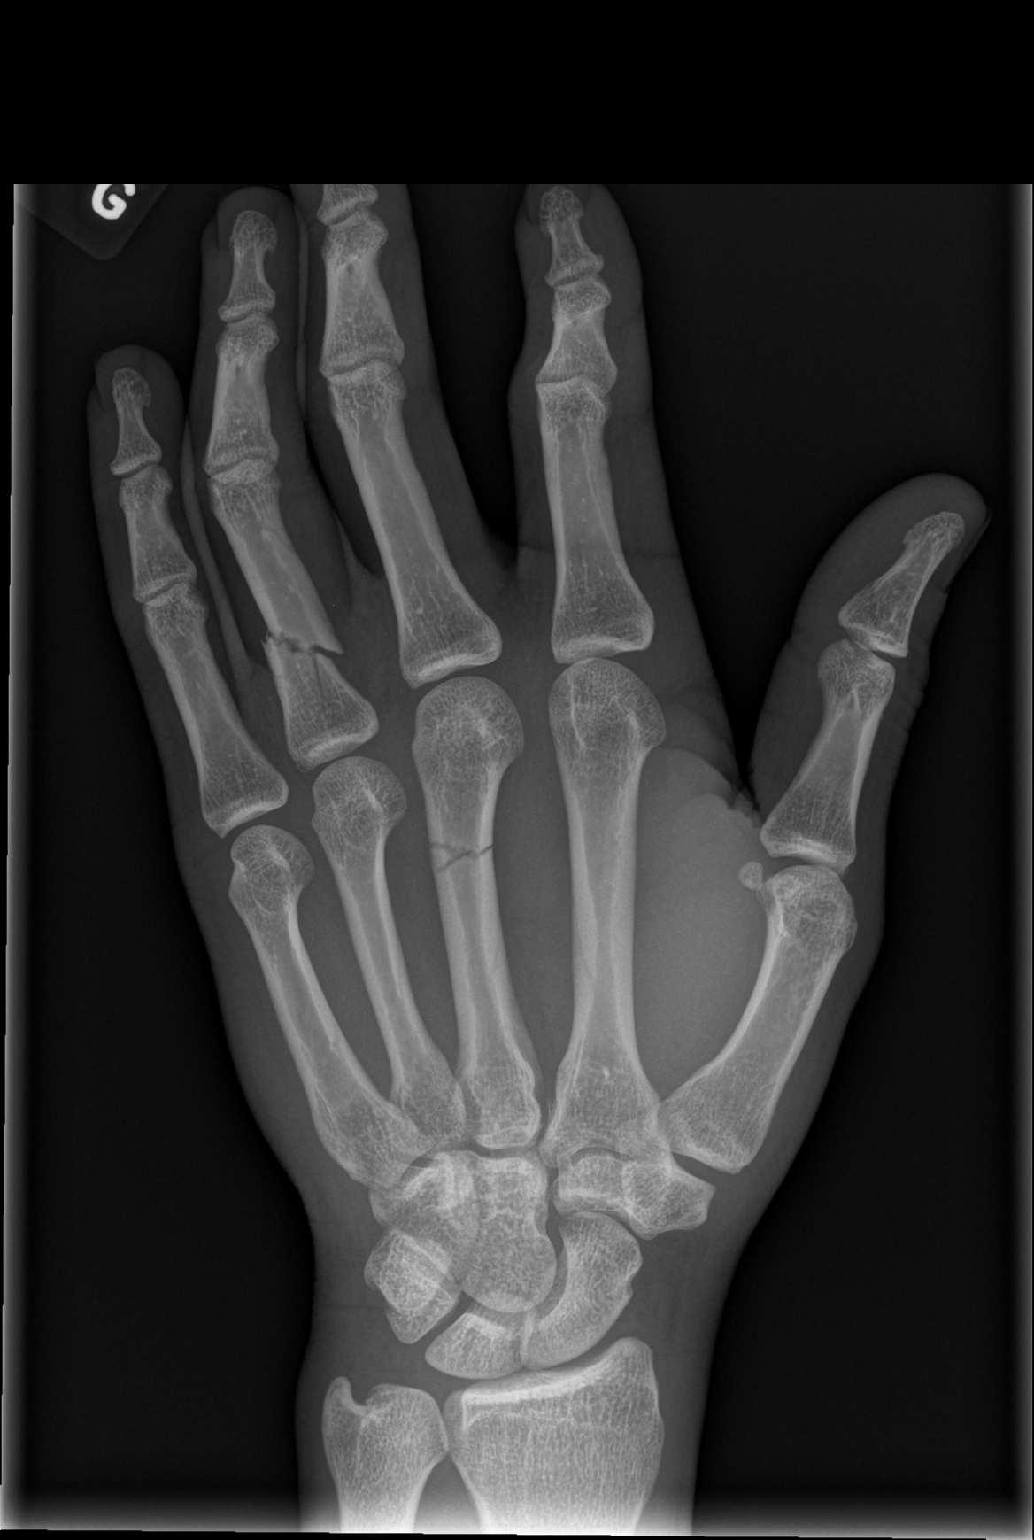

[x hand lat left]
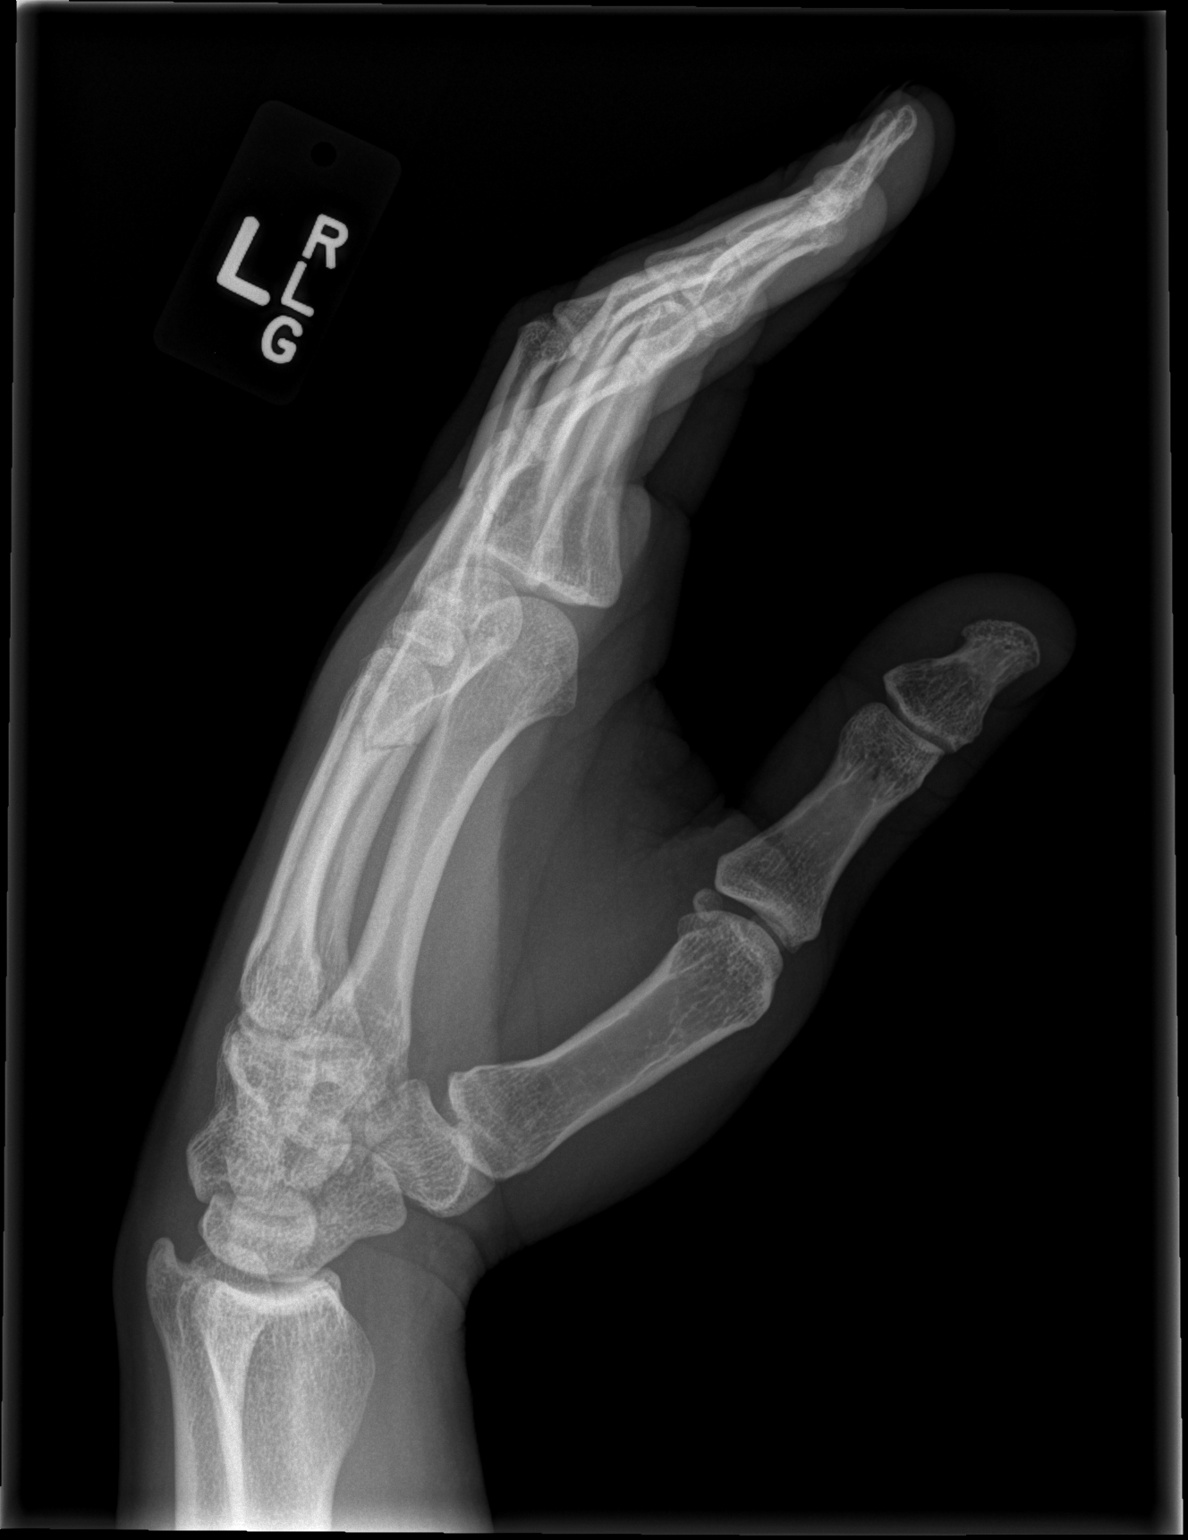

[3 of 3 positions shown; findings below may reference images not displayed]

FINDINGS: The patient has a nondisplaced transverse fracture of the distal
diaphysis of the third metacarpal. There is also a slightly oblique
fracture through the proximal diaphysis of the proximal phalanx of
the ring finger. This fracture shows radial displacement of 0.4 cm.
A component of the fracture radiates into the metaphysis but the
articular surface does not appear disrupted. No other fracture is
identified. No radiopaque foreign body is seen.
IMPRESSION: Fractures of the proximal phalanx of the ring finger and third
metacarpal open left hand as described above.

## 2017-07-20 ENCOUNTER — Ambulatory Visit (HOSPITAL_COMMUNITY)
Admission: EM | Admit: 2017-07-20 | Discharge: 2017-07-20 | Disposition: A | Discharge status: Home / Self Care | Payer: Self-pay | Attending: Emergency Medicine | Admitting: Emergency Medicine

## 2017-07-20 ENCOUNTER — Encounter (HOSPITAL_COMMUNITY): Payer: Self-pay | Admitting: Emergency Medicine

## 2017-07-20 DIAGNOSIS — H1031 Unspecified acute conjunctivitis, right eye: Secondary | ICD-10-CM

## 2017-07-20 MED ORDER — POLYMYXIN B-TRIMETHOPRIM 10000-0.1 UNIT/ML-% OP SOLN: 1.0000 [drp] | OPHTHALMIC | 0 refills | Status: DC

## 2017-07-20 NOTE — ED Triage Notes (Signed)
Pt c/o drainage and redness in R eye.

## 2017-07-20 NOTE — Discharge Instructions (Signed)
Use the eyedrops as directed. Warm compresses. If this is viral and will just get better on its own in a few days. Wash hands frequently.

## 2017-07-20 NOTE — ED Provider Notes (Signed)
[facility]    CSN: [id number] Arrival date & time: 07/20/17  [id number]     History   Chief Complaint Chief Complaint  Patient presents with  . Conjunctivitis    H[facility] 26 [facility].   26 year old male complaining of some pressure and irritation to the right eye starting 4-5 days ago. He is having some redness and swelling to the upper and lower eyelids associated with watery drainage.      History reviewed. No pertinent past medical history.  There are no active problems to display for this patient.   History reviewed. No pertinent surgical history.     Home Medications    Prior to Admission medications   Medication Sig Start Date End Date Taking? Authorizing Provider  ibuprofen (ADVIL,MOTRIN) 800 MG tablet Take 1 tablet (800 mg total) by mouth 3 (three) times daily. Patient taking differently: Take 800 mg by mouth daily as needed for headache.  03/06/15  Yes Danelle Berryapia, Leisa, PA-C  OVER THE COUNTER MEDICATION Take 2 capsules by mouth daily as needed (allergies). allergiclear S   Yes [provider]  Ascorbic Acid (VITAMIN C) 1000 MG tablet Take 1,000 mg by mouth daily.    [provider]  cephALEXin (KEFLEX) 500 MG capsule Take 500 mg by mouth 4 (four) times daily.    [provider]  ibuprofen (ADVIL,MOTRIN) 200 MG tablet Take 800 mg by mouth every 6 (six) hours as needed for headache or mild pain.     [provider]  oxyCODONE-acetaminophen (PERCOCET/ROXICET) 5-325 MG tablet Take 1 tablet by mouth every 4 (four) hours as needed. 01/07/16   Garlon HatchetSanders, Lisa M, PA-C  trimethoprim-polymyxin b Joaquim Lai(POLYTRIM) ophthalmic solution Place 1 drop every 4 (four) hours into the right eye. 07/20/17   Hayden RasmussenMabe, Ramatoulaye Pack, NP    Family History No family history on file.  Social History Social History   Tobacco Use  . Smoking status: Never Smoker  . Smokeless tobacco: Never Used  Substance Use Topics  . Alcohol use: Yes    Comment: rarely  . Drug use: No     Allergies   Patient has no known allergies.   Review of Systems Review of Systems  Constitutional: Negative.  Negative for fever.  HENT: Negative for congestion, ear pain, postnasal drip and sore throat.   Eyes: Positive for discharge, redness and itching.  Respiratory: Negative.   All other systems reviewed and are negative.    Physical Exam Triage Vital Signs ED Triage Vitals [07/20/17 1009]  Enc Vitals Group     BP 109/64     Pulse Rate 94     Resp 18     Temp 98.4 F (36.9 C)     Temp src      SpO2 100 %     Weight      Height      Head Circumference      Peak Flow      Pain Score      Pain Loc      Pain Edu?      Excl. in GC?    No data found.  Updated Vital Signs BP 109/64   Pulse 94   Temp 98.4 F (36.9 C)   Resp 18   SpO2 100%   Visual Acuity Right Eye Distance:   Left Eye Distance:   Bilateral Distance:    Right Eye Near:   Left Eye Near:    Bilateral Near:  Physical Exam  Constitutional: He is oriented to person, place, and time. He appears well-developed and well-nourished. No distress.  HENT:  Head: Normocephalic and atraumatic.  Right Ear: External ear normal.  Left Ear: External ear normal.  Eyes: EOM are normal. Pupils are equal, round, and reactive to light.  Right lower lid with minor erythema and swelling. Lower lid conjunctiva with swelling and redness. Clear watery discharge. Minor scleral injection. Anterior chamber is clear. Full EOM.  Neck: Normal range of motion. Neck supple.  Cardiovascular: Normal rate.  Pulmonary/Chest: Effort normal. No respiratory distress.  Musculoskeletal: He exhibits no edema.  Neurological: He is alert and oriented to person, place, and time. He exhibits normal muscle tone.  Skin: Skin is warm and dry.  Psychiatric: He has a normal mood and affect.  Nursing note and vitals reviewed.    UC Treatments / Results  Labs (all labs ordered are listed, but  only abnormal results are displayed) Labs Reviewed - No data to display  EKG  EKG Interpretation None       Radiology No results found.  Procedures Procedures (including critical care time)  Medications Ordered in UC Medications - No data to display   Initial Impression / Assessment and Plan / UC Course  I have reviewed the triage vital signs and the nursing notes.  Pertinent labs & imaging results that were available during my care of the patient were reviewed by me and considered in my medical decision making (see chart for details).    Use the eyedrops as directed. Warm compresses. If this is viral and will just get better on its own in a few days. Wash hands frequently.     Final Clinical Impressions(s) / UC Diagnoses   Final diagnoses:  Acute conjunctivitis of right eye, unspecified acute conjunctivitis type    ED Discharge Orders        Ordered    trimethoprim-polymyxin b (POLYTRIM) ophthalmic solution  Every 4 hours     07/20/17 1025       Controlled Substance Prescriptions [place] Controlled Substance Registry consulted? Not Applicable   Hayden RasmussenMabe, Brendy Ficek, NP 07/20/17 1028

## 2020-10-24 ENCOUNTER — Other Ambulatory Visit: Payer: Self-pay

## 2020-10-24 ENCOUNTER — Ambulatory Visit: Payer: Commercial Managed Care - PPO | Admitting: Podiatry

## 2020-10-24 DIAGNOSIS — G5763 Lesion of plantar nerve, bilateral lower limbs: Secondary | ICD-10-CM

## 2020-10-24 DIAGNOSIS — L84 Corns and callosities: Secondary | ICD-10-CM

## 2020-10-24 DIAGNOSIS — M2042 Other hammer toe(s) (acquired), left foot: Secondary | ICD-10-CM | POA: Diagnosis not present

## 2020-10-24 DIAGNOSIS — M2041 Other hammer toe(s) (acquired), right foot: Secondary | ICD-10-CM

## 2020-10-24 NOTE — Progress Notes (Signed)
   Subjective: 30 y.o. male presenting to the office today as a new patient for evaluation of pain and tenderness to the bilateral forefoot.  Patient states that when he was a young teenager he used to have forefoot pain and he was seen by podiatrist who recommended hammertoe surgery.  He did not undergo surgery.  He presents because he has symptomatic calluses to the bilateral balls of the feet.  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   No past medical history on file.   Objective:  Physical Exam General: Alert and oriented x3 in no acute distress  Dermatology: Hyperkeratotic lesion(s) present on the plantar aspect of the bilateral forefoot. Pain on palpation with a central nucleated core noted. Skin is warm, dry and supple bilateral lower extremities. Negative for open lesions or macerations.  Vascular: Palpable pedal pulses bilaterally. No edema or erythema noted. Capillary refill within normal limits.  Neurological: Epicritic and protective threshold grossly intact bilaterally.   Musculoskeletal Exam: Pain on palpation at the keratotic lesion(s) noted. Range of motion within normal limits bilateral. Muscle strength 5/5 in all groups bilateral. Hammertoe deformity noted digits 2-4 bilateral  Assessment: 1.  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  Hyperkeratotic symptomatic calluses bilateral forefoot 3.  Metatarsalgia bilateral   Plan of Care:  1. Patient evaluated 2. Excisional debridement of keratoic lesion(s) using a chisel blade was performed without incident.  3. Dressed area with light dressing. 4.  Offloading felt dancers pads were provided to apply to the insoles the bilateral feet to offload pressure to the forefoot 5.  Toe spacers were provided for the patient.  He says that he used to wear toe spacers nightly which helped significantly  6.  Patient is to return to the clinic PRN.   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   Felecia Shelling,  DPM [facility]  Dr. Felecia Shelling, DPM    [address]                                        [place], [place] [address]                Office [phone]  Fax [phone]

## 2020-12-24 ENCOUNTER — Encounter (HOSPITAL_BASED_OUTPATIENT_CLINIC_OR_DEPARTMENT_OTHER): Payer: Self-pay | Admitting: Emergency Medicine

## 2020-12-24 ENCOUNTER — Emergency Department (HOSPITAL_BASED_OUTPATIENT_CLINIC_OR_DEPARTMENT_OTHER)
Admission: EM | Admit: 2020-12-24 | Discharge: 2020-12-24 | Disposition: A | Discharge status: Home / Self Care | Payer: Self-pay | Attending: Emergency Medicine | Admitting: Emergency Medicine

## 2020-12-24 ENCOUNTER — Other Ambulatory Visit: Payer: Self-pay

## 2020-12-24 DIAGNOSIS — U071 COVID-19: Secondary | ICD-10-CM | POA: Insufficient documentation

## 2020-12-24 DIAGNOSIS — Z28310 Unvaccinated for covid-19: Secondary | ICD-10-CM | POA: Insufficient documentation

## 2020-12-24 DIAGNOSIS — R110 Nausea: Secondary | ICD-10-CM | POA: Insufficient documentation

## 2020-12-24 LAB — RESP PANEL BY RT-PCR (FLU A&B, COVID) ARPGX2
Influenza A by PCR: NEGATIVE
Influenza B by PCR: NEGATIVE
SARS Coronavirus 2 by RT PCR: POSITIVE — AB

## 2020-12-24 MED ORDER — ONDANSETRON 4 MG PO TBDP: 4.0000 mg | ORAL_TABLET | ORAL | 0 refills | Status: DC | PRN

## 2020-12-24 MED ORDER — KETOROLAC TROMETHAMINE 60 MG/2ML IM SOLN
60.0000 mg | Freq: Once | INTRAMUSCULAR | Status: AC
  Administered 2020-12-24: 60 mg via INTRAMUSCULAR
  Filled 2020-12-24: qty 2

## 2020-12-24 MED ORDER — IBUPROFEN 800 MG PO TABS: 800.0000 mg | ORAL_TABLET | Freq: Three times a day (TID) | ORAL | 0 refills | Status: DC

## 2020-12-24 MED ORDER — ACETAMINOPHEN 500 MG PO TABS
1000.0000 mg | ORAL_TABLET | Freq: Once | ORAL | Status: AC
  Administered 2020-12-24: 1000 mg via ORAL
  Filled 2020-12-24: qty 2

## 2020-12-24 MED ORDER — ACETAMINOPHEN 500 MG PO TABS: 1000.0000 mg | ORAL_TABLET | Freq: Four times a day (QID) | ORAL | 0 refills | Status: AC | PRN

## 2020-12-24 MED ORDER — ONDANSETRON 4 MG PO TBDP
4.0000 mg | ORAL_TABLET | Freq: Once | ORAL | Status: AC
  Administered 2020-12-24: 4 mg via ORAL
  Filled 2020-12-24: qty 1

## 2020-12-24 NOTE — Discharge Instructions (Signed)
1.  Take ibuprofen 800 mg every 8 hours for fever and body aches.  You may also take acetaminophen extra strength Tylenol (1000 mg) every 6 hours if needed for additional pain and fever control.  Take Zofran as prescribed every 4 hours if you have nausea or vomiting.  Stay hydrated.  Rest for the next 3 days. 2.  You must isolate yourself for 5 days.  You should be fever free for 2 days and feeling better before you resume activities. 3.  Follow-up with a family doctor for recheck.  If you do not have a family doctor, use the referral number in your discharge instructions to find 1. 4.  Return to the emergency department if you are getting worsening symptoms such as chest pain difficulty breathing or other concerning symptoms.

## 2020-12-24 NOTE — ED Triage Notes (Signed)
Pt via pov from home with generalized body aches and fevertbeginning yesterday.. Pt states he did home test for covid 19 that was positive. Pt sleeping, but easily awakened for triage/assessment.

## 2020-12-24 NOTE — ED Notes (Signed)
Pt ready to leave; informed him we will have discharge papers soon.

## 2020-12-24 NOTE — ED Provider Notes (Signed)
[facility] EMERGENCY DEPT Provider Note   CSN: [id number] Arrival date & time: 12/24/20  1844     History Chief Complaint  Patient presents with  . Fever  . Generalized Body Aches    Jason Campbell is a 30 y.o. male.  HPI Patient reports yesterday he started feeling sick.  He started getting body aches and fever.  He reports today he took a home COVID test that was positive.  He has not had any vomiting.  He has felt nauseated.  He has been very fatigued.  He has general headache.  No chest pain or shortness of breath.  Very mild cough.  Patient has not had COVID vaccination.  He denies other medical problems.  He tried 1 dose of acetaminophen at home.    History reviewed. No pertinent past medical history.  Patient Active Problem List   Diagnosis Date Noted  . Other atopic dermatitis and related conditions 03/27/2010  . Allergic rhinitis 01/11/2010    Past Surgical History:  Procedure Laterality Date  . OPEN REDUCTION INTERNAL FIXATION (ORIF) METACARPAL Left 01/12/2016   Procedure: OPEN REDUCTION INTERNAL FIXATION (ORIF) LEFT RING FINGER AND LEFT THIRD METACARPAL;  Surgeon: Dominica Severin, MD;  [facility];  Service: Orthopedics;  Laterality: Left;       No family history on file.  Social History   Tobacco Use  . Smoking status: Never Smoker  . Smokeless tobacco: Never Used  Vaping Use  . Vaping Use: Never used  Substance Use Topics  . Alcohol use: Yes    Comment: rarely  . Drug use: Yes    Frequency: 3.0 times per week    Types: Marijuana    Home Medications Prior to Admission medications   Medication Sig Start Date End Date Taking? Authorizing Provider  acetaminophen (TYLENOL) 500 MG tablet Take 2 tablets (1,000 mg total) by mouth every 6 (six) hours as needed for moderate pain or fever. 12/24/20  Yes Arby Barrette, MD  ibuprofen (ADVIL) 800 MG tablet Take 1 tablet (800 mg total) by mouth 3 (three) times daily. 12/24/20  Yes  Arby Barrette, MD  ondansetron (ZOFRAN ODT) 4 MG disintegrating tablet Take 1 tablet (4 mg total) by mouth every 4 (four) hours as needed for nausea or vomiting. 12/24/20  Yes Arby Barrette, MD  Ascorbic Acid (VITAMIN C) 1000 MG tablet Take 1,000 mg by mouth daily.    [provider]  cephALEXin (KEFLEX) 500 MG capsule Take 500 mg by mouth 4 (four) times daily.    [provider]  HYDROcodone-acetaminophen (NORCO/VICODIN) 5-325 MG tablet Take 1 tablet by mouth every 4 (four) hours as needed. 09/27/20   [provider]  ibuprofen (ADVIL,MOTRIN) 200 MG tablet Take 800 mg by mouth every 6 (six) hours as needed for headache or mild pain.     [provider]  ibuprofen (ADVIL,MOTRIN) 800 MG tablet Take 1 tablet (800 mg total) by mouth 3 (three) times daily. Patient taking differently: Take 800 mg by mouth daily as needed for headache.  03/06/15   Danelle Berry, PA-C  OVER THE COUNTER MEDICATION Take 2 capsules by mouth daily as needed (allergies). allergiclear S    [provider]  oxyCODONE-acetaminophen (PERCOCET/ROXICET) 5-325 MG tablet Take 1 tablet by mouth every 4 (four) hours as needed. 01/07/16   Garlon Hatchet, PA-C  trimethoprim-polymyxin b Joaquim Lai) ophthalmic solution Place 1 drop every 4 (four) hours into the right eye. 07/20/17   Hayden Rasmussen, NP  Allergies    Patient has no known allergies.  Review of Systems   Review of Systems 10 systems reviewed negative except as per HPI Physical Exam Updated Vital Signs BP (!) 108/46 (BP Location: Right Arm)   Pulse 85   Temp (!) 103 F (39.4 C) (Oral)   Resp 18   Ht 5\' 8"  (1.727 m)   Wt 82.6 kg   SpO2 100%   BMI 27.67 kg/m   Physical Exam Constitutional:      Comments: Alert without respiratory distress.  Patient does appear uncomfortable.  Well-nourished well-developed.  HENT:     Head: Normocephalic and atraumatic.     Mouth/Throat:     Mouth: Mucous membranes are moist.      Pharynx: Oropharynx is clear.  Eyes:     Extraocular Movements: Extraocular movements intact.     Conjunctiva/sclera: Conjunctivae normal.  Cardiovascular:     Rate and Rhythm: Normal rate and regular rhythm.     Pulses: Normal pulses.     Heart sounds: Normal heart sounds.  Pulmonary:     Effort: Pulmonary effort is normal.     Breath sounds: Normal breath sounds.  Abdominal:     General: There is no distension.     Palpations: Abdomen is soft.     Tenderness: There is no abdominal tenderness. There is no guarding.  Musculoskeletal:        General: No swelling or tenderness. Normal range of motion.     Right lower leg: No edema.     Left lower leg: No edema.  Skin:    General: Skin is warm and dry.  Neurological:     General: No focal deficit present.     Mental Status: He is oriented to person, place, and time.     Cranial Nerves: No cranial nerve deficit.     Coordination: Coordination normal.  Psychiatric:        Mood and Affect: Mood normal.     ED Results / Procedures / Treatments   Labs (all labs ordered are listed, but only abnormal results are displayed) Labs Reviewed  RESP PANEL BY RT-PCR (FLU A&B, COVID) ARPGX2 - Abnormal; Notable for the following components:      Result Value   SARS Coronavirus 2 by RT PCR POSITIVE (*)    All other components within normal limits    EKG None  Radiology No results found.  Procedures Procedures   Medications Ordered in ED Medications  acetaminophen (TYLENOL) tablet 1,000 mg (1,000 mg Oral Given 12/24/20 1944)  ketorolac (TORADOL) injection 60 mg (60 mg Intramuscular Given 12/24/20 1946)  ondansetron (ZOFRAN-ODT) disintegrating tablet 4 mg (4 mg Oral Given 12/24/20 1945)    ED Course  I have reviewed the triage vital signs and the nursing notes.  Pertinent labs & imaging results that were available during my care of the patient were reviewed by me and considered in my medical decision making (see chart for  details).    MDM Rules/Calculators/A&P                          Patient is otherwise healthy 30 year old.  Developed symptoms yesterday of fever, myalgia, headache.  No significant chest pain or shortness of breath.  Patient tested COVID-positive at home.  He does not have hypoxia.  He does not have risk factors for severe course of illness.  Counseled on symptomatic treatment and return precautions.  Ambulatory referral to COVID clinic placed.  Final Clinical Impression(s) / ED Diagnoses Final diagnoses:  COVID-19    Rx / DC Orders ED Discharge Orders         Ordered    ondansetron (ZOFRAN ODT) 4 MG disintegrating tablet  Every 4 hours PRN        12/24/20 2153    ibuprofen (ADVIL) 800 MG tablet  3 times daily        12/24/20 2153    acetaminophen (TYLENOL) 500 MG tablet  Every 6 hours PRN        12/24/20 2153    Ambulatory referral for Covid Treatment        12/24/20 2156           Arby Barrette, MD 12/24/20 2200

## 2020-12-25 ENCOUNTER — Telehealth: Payer: Self-pay

## 2020-12-25 NOTE — Telephone Encounter (Signed)
Called to discuss with patient about COVID-19 symptoms and the use of one of the available treatments for those with mild to moderate Covid symptoms and at a high risk of hospitalization.  Pt appears to qualify for outpatient treatment due to co-morbid conditions and/or a member of an at-risk group in accordance with the FDA Emergency Use Authorization.    Symptom onset: Fever,body aches 12/23/20 Vaccinated: Unknown Booster? Unknown Immunocompromised? No Qualifiers:   Unable to reach pt - Left message and call back number [phone].   Esther Hardy

## 2024-05-28 ENCOUNTER — Encounter (HOSPITAL_COMMUNITY): Payer: Self-pay

## 2024-05-28 ENCOUNTER — Emergency Department (HOSPITAL_COMMUNITY)
Admission: EM | Admit: 2024-05-28 | Discharge: 2024-05-28 | Disposition: A | Discharge status: Home / Self Care | Payer: Self-pay | Attending: Emergency Medicine | Admitting: Emergency Medicine

## 2024-05-28 ENCOUNTER — Other Ambulatory Visit: Payer: Self-pay

## 2024-05-28 DIAGNOSIS — L723 Sebaceous cyst: Secondary | ICD-10-CM | POA: Insufficient documentation

## 2024-05-28 MED ORDER — HYDROCORTISONE 1 % EX CREA: TOPICAL_CREAM | CUTANEOUS | 0 refills | Status: AC

## 2024-05-28 NOTE — ED Provider Notes (Signed)
 [place] EMERGENCY DEPARTMENT AT [facility] Provider Note   CSN: [id number] Arrival date & time: 05/28/24  1740     Patient presents with: Cyst   Jason Campbell is a 33 y.o. male presents today for several small cyst to the right side of his face and behind his right ear.  Patient denies fever, chills, nausea, vomiting, or any other complaints.   HPI     Prior to Admission medications   Medication Sig Start Date End Date Taking? Authorizing Provider  acetaminophen  (TYLENOL ) 500 MG tablet Take 2 tablets (1,000 mg total) by mouth every 6 (six) hours as needed for moderate pain or fever. 12/24/20   Armenta Canning, MD  Ascorbic Acid (VITAMIN C) 1000 MG tablet Take 1,000 mg by mouth daily.    [provider]  cephALEXin (KEFLEX) 500 MG capsule Take 500 mg by mouth 4 (four) times daily.    [provider]  HYDROcodone -acetaminophen  (NORCO/VICODIN) 5-325 MG tablet Take 1 tablet by mouth every 4 (four) hours as needed. 09/27/20   [provider]  ibuprofen  (ADVIL ) 800 MG tablet Take 1 tablet (800 mg total) by mouth 3 (three) times daily. 12/24/20   Armenta Canning, MD  ibuprofen  (ADVIL ,MOTRIN ) 200 MG tablet Take 800 mg by mouth every 6 (six) hours as needed for headache or mild pain.     [provider]  ibuprofen  (ADVIL ,MOTRIN ) 800 MG tablet Take 1 tablet (800 mg total) by mouth 3 (three) times daily. Patient taking differently: Take 800 mg by mouth daily as needed for headache.  03/06/15   Tapia, Leisa, PA-C  ondansetron  (ZOFRAN  ODT) 4 MG disintegrating tablet Take 1 tablet (4 mg total) by mouth every 4 (four) hours as needed for nausea or vomiting. 12/24/20   Armenta Canning, MD  OVER THE COUNTER MEDICATION Take 2 capsules by mouth daily as needed (allergies). allergiclear S    [provider]  oxyCODONE -acetaminophen  (PERCOCET/ROXICET) 5-325 MG tablet Take 1 tablet by mouth every 4 (four) hours as needed. 01/07/16   Jarold Olam HERO, PA-C  trimethoprim -polymyxin b  (POLYTRIM ) ophthalmic solution Place 1 drop every 4 (four) hours into the right eye. 07/20/17   Tharon Lenis, NP    Allergies: Patient has no known allergies.    Review of Systems  Updated Vital Signs BP (!) 150/79 (BP Location: Right Arm)   Pulse 94   Temp 98.2 F (36.8 C) (Oral)   Resp 18   Ht 5' 8 (1.727 m)   Wt 80.7 kg   SpO2 98%   BMI 27.06 kg/m   Physical Exam Vitals and nursing note reviewed.  Constitutional:      General: He is not in acute distress.    Appearance: Normal appearance. He is well-developed.  HENT:     Head: Normocephalic and atraumatic.      Comments: Multiple sebaceous cyst noted on exam no surrounding erythema, warmth, or other signs of acute infection.    Right Ear: External ear normal.     Left Ear: External ear normal.  Eyes:     Conjunctiva/sclera: Conjunctivae normal.  Cardiovascular:     Rate and Rhythm: Normal rate and regular rhythm.     Heart sounds: No murmur heard. Pulmonary:     Effort: Pulmonary effort is normal. No respiratory distress.     Breath sounds: Normal breath sounds.  Abdominal:     Palpations: Abdomen is soft.     Tenderness: There is no abdominal tenderness.  Musculoskeletal:  General: No swelling.     Cervical back: Neck supple.  Skin:    General: Skin is warm and dry.     Capillary Refill: Capillary refill takes less than 2 seconds.  Neurological:     Mental Status: He is alert.  Psychiatric:        Mood and Affect: Mood normal.     (all labs ordered are listed, but only abnormal results are displayed) Labs Reviewed - No data to display  EKG: None  Radiology: No results found.   Procedures   Medications Ordered in the ED - No data to display                                  Medical Decision Making  This patient presents to the ED for concern of cyst differential diagnosis includes abscess, cellulitis, sebaceous cyst, infected sebaceous cyst, at  bedtime    Additional history obtained   Additional history obtained from Electronic Medical Record External records from outside source obtained and reviewed including podiatry note   Problem List / ED Course:  Considered for admission or further workup however patient's vital signs and physical exam are reassuring.  Patient's symptoms consistent with sebaceous cyst.  Patient does not appear to have any acute infection at this time.  Patient advised to follow-up with dermatology for further evaluation and treatment.  Patient given return precautions.  I feel patient safe for discharge at this time.       Final diagnoses:  Sebaceous cyst    ED Discharge Orders     None          Francis Ileana LOISE DEVONNA 05/28/24 1844    Laurice Maude BROCKS, MD 05/28/24 2224

## 2024-05-28 NOTE — ED Triage Notes (Signed)
 Pt has several small cysts to the right side of face and behind right ear.

## 2024-05-28 NOTE — Discharge Instructions (Signed)
 Today you were seen for what I suspect are sebaceous cyst on your face.  Please follow-up with dermatology for further evaluation and workup.  Please return to the ED if you have increased pain, redness surrounding the areas, or warmth as this may require antibiotic treatment.  Thank you for letting us  treat you today. After performing a physical exam, I feel you are safe to go home. Please follow up with your PCP in the next several days and provide them with your records from this visit. Return to the Emergency Room if pain becomes severe or symptoms worsen.

## 2024-07-27 ENCOUNTER — Encounter (HOSPITAL_BASED_OUTPATIENT_CLINIC_OR_DEPARTMENT_OTHER): Payer: Self-pay

## 2024-07-27 ENCOUNTER — Emergency Department (HOSPITAL_BASED_OUTPATIENT_CLINIC_OR_DEPARTMENT_OTHER)
Admission: EM | Admit: 2024-07-27 | Discharge: 2024-07-27 | Disposition: A | Discharge status: Home / Self Care | Attending: Emergency Medicine | Admitting: Emergency Medicine

## 2024-07-27 ENCOUNTER — Other Ambulatory Visit: Payer: Self-pay

## 2024-07-27 DIAGNOSIS — T7840XA Allergy, unspecified, initial encounter: Secondary | ICD-10-CM | POA: Diagnosis present

## 2024-07-27 MED ORDER — DEXAMETHASONE 4 MG PO TABS
12.0000 mg | ORAL_TABLET | Freq: Once | ORAL | Status: AC
  Administered 2024-07-27: 12 mg via ORAL
  Filled 2024-07-27: qty 3

## 2024-07-27 MED ORDER — EPINEPHRINE 0.3 MG/0.3ML IJ SOAJ: 0.3000 mg | INTRAMUSCULAR | 0 refills | Status: AC | PRN

## 2024-07-27 NOTE — ED Triage Notes (Signed)
 Pt complaining of an area to the right side of his right eye that is swollen and is painful to touch. Said it came up after eating cornbread last night.

## 2024-07-27 NOTE — ED Provider Notes (Signed)
 [place] EMERGENCY DEPARTMENT AT [facility] Provider Note   CSN: [id number] Arrival date & time: 07/27/24  9693     Patient presents with: Allergic Reaction   Jason Campbell is a 33 y.o. male.   The history is provided by the patient and a parent.  Allergic Reaction Presenting symptoms: itching, rash and swelling   Presenting symptoms: no difficulty breathing, no difficulty swallowing and no wheezing   Severity:  Moderate Context: food   Relieved by:  Antihistamines Worsened by:  Nothing Patient presents for allergic reaction.  Patient was eating cornbread last night around 9 PM, and soon after started having swelling near his right eye.  He has taken Benadryl with some improvement.  He reports localized itching but that is improved. No vomiting or diarrhea.  No shortness of breath.  No tongue or lip swelling.  Patient was recently seen as an outpatient by his PCP for hives and had allergy testing, was prescribed prednisone  as well as an EpiPen .  He considered using the EpiPen  tonight but elected not to.  He is waiting on a referral to allergist. Mother is at bedside reports he had severe allergies as a child but appeared to age out of them.     Prior to Admission medications   Medication Sig Start Date End Date Taking? Authorizing Provider  EPINEPHrine  0.3 mg/0.3 mL IJ SOAJ injection Inject 0.3 mg into the muscle as needed for anaphylaxis. 07/27/24  Yes Midge Golas, MD  acetaminophen  (TYLENOL ) 500 MG tablet Take 2 tablets (1,000 mg total) by mouth every 6 (six) hours as needed for moderate pain or fever. 12/24/20   Armenta Canning, MD  Ascorbic Acid (VITAMIN C) 1000 MG tablet Take 1,000 mg by mouth daily.    [provider]  hydrocortisone  cream 1 % Apply to affected area 2 times daily 05/28/24   Keith, Kayla N, PA-C    Allergies: Patient has no known allergies.    Review of Systems  Constitutional:  Negative for fever.  HENT:  Positive for  facial swelling. Negative for trouble swallowing.   Respiratory:  Negative for shortness of breath and wheezing.   Gastrointestinal:  Negative for diarrhea and vomiting.  Skin:  Positive for itching and rash.    Updated Vital Signs BP 119/69 (BP Location: Right Arm)   Pulse 71   Temp 98.4 F (36.9 C)   Resp 20   Ht 1.727 m (5' 8)   Wt 83 kg   SpO2 100%   BMI 27.83 kg/m   Physical Exam CONSTITUTIONAL: Well developed/well nourished HEAD: Normocephalic/atraumatic EYES: EOMI/PERRL Mild soft tissue swelling noted lateral to the right eye, no bruising, no erythema, no urticaria, no tenderness No other periorbital edema is noted ENMT: Mucous membranes moist No angioedema, no stridor, no drooling NECK: supple no meningeal signs CV: S1/S2 noted, no murmurs/rubs/gallops noted LUNGS: Lungs are clear to auscultation bilaterally, no apparent distress NEURO: Pt is awake/alert/appropriate, moves all extremitiesx4.  No facial droop.   EXTREMITIES: pulses normal/equal, full ROM SKIN: warm, color normal, no rash, no hives  (all labs ordered are listed, but only abnormal results are displayed) Labs Reviewed - No data to display  EKG: None  Radiology: No results found.   Procedures   Medications Ordered in the ED  dexamethasone  (DECADRON ) tablet 12 mg (has no administration in time range)  Medical Decision Making Risk Prescription drug management.   Patient presents for presumed allergic reaction. He ate cornbread and soon after had swelling to the right side of his face.  It appears to be improving He has no signs of anaphylaxis.  He has already taken Benadryl.  No indication for EpiPen  currently. Will give one-time dose of Decadron .  Referral to allergy has been placed. 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  He was advised to avoid  all nuts for now. He was advised to have an EpiPen  with him at all times.  He will reach out to his PCP for lab testing Discussed strict ER return precaution    Final diagnoses:  Allergic reaction, initial encounter    ED Discharge Orders          Ordered    Ambulatory referral to Allergy        07/27/24 0406    EPINEPHrine  0.3 mg/0.3 mL IJ SOAJ injection  As needed        07/27/24 0408               Midge Golas, MD 07/27/24 [phone]

## 2024-08-17 ENCOUNTER — Ambulatory Visit: Admitting: Internal Medicine

## 2025-02-28 ENCOUNTER — Ambulatory Visit: Payer: Self-pay | Admitting: Dermatology
